# Patient Record
Sex: Female | Born: 1975 | Race: White | Hispanic: No | Marital: Married | Smoking: Never smoker
Health system: Southern US, Community
[De-identification: ages and names within clinical notes are randomized; demographics above are authoritative.]

## PROBLEM LIST (undated history)

## (undated) ENCOUNTER — Inpatient Hospital Stay (HOSPITAL_COMMUNITY): Payer: Self-pay

## (undated) DIAGNOSIS — O09299 Supervision of pregnancy with other poor reproductive or obstetric history, unspecified trimester: Secondary | ICD-10-CM

## (undated) DIAGNOSIS — T4145XA Adverse effect of unspecified anesthetic, initial encounter: Secondary | ICD-10-CM

## (undated) DIAGNOSIS — T8859XA Other complications of anesthesia, initial encounter: Secondary | ICD-10-CM

## (undated) DIAGNOSIS — D649 Anemia, unspecified: Secondary | ICD-10-CM

## (undated) DIAGNOSIS — O09529 Supervision of elderly multigravida, unspecified trimester: Secondary | ICD-10-CM

## (undated) DIAGNOSIS — Z8619 Personal history of other infectious and parasitic diseases: Secondary | ICD-10-CM

## (undated) HISTORY — PX: TONSILLECTOMY: SUR1361

## (undated) HISTORY — DX: Other complications of anesthesia, initial encounter: T88.59XA

## (undated) HISTORY — DX: Anemia, unspecified: D64.9

## (undated) HISTORY — DX: Personal history of other infectious and parasitic diseases: Z86.19

## (undated) HISTORY — DX: Adverse effect of unspecified anesthetic, initial encounter: T41.45XA

## (undated) HISTORY — DX: Supervision of elderly multigravida, unspecified trimester: O09.529

## (undated) HISTORY — PX: CHOLECYSTECTOMY: SHX55

## (undated) HISTORY — DX: Supervision of pregnancy with other poor reproductive or obstetric history, unspecified trimester: O09.299

---

## 2011-08-16 ENCOUNTER — Other Ambulatory Visit (HOSPITAL_COMMUNITY): Payer: Self-pay | Admitting: Obstetrics and Gynecology

## 2011-08-16 DIAGNOSIS — O09529 Supervision of elderly multigravida, unspecified trimester: Secondary | ICD-10-CM

## 2011-08-16 DIAGNOSIS — Z3689 Encounter for other specified antenatal screening: Secondary | ICD-10-CM

## 2011-08-16 LAB — OB RESULTS CONSOLE RUBELLA ANTIBODY, IGM: Rubella: IMMUNE

## 2011-08-16 LAB — OB RESULTS CONSOLE HIV ANTIBODY (ROUTINE TESTING): HIV: NONREACTIVE

## 2011-08-16 LAB — OB RESULTS CONSOLE GC/CHLAMYDIA: Chlamydia: NEGATIVE

## 2011-08-16 LAB — OB RESULTS CONSOLE ABO/RH: RH Type: POSITIVE

## 2011-08-16 LAB — OB RESULTS CONSOLE HEPATITIS B SURFACE ANTIGEN: Hepatitis B Surface Ag: NEGATIVE

## 2011-08-16 LAB — OB RESULTS CONSOLE ANTIBODY SCREEN: Antibody Screen: NEGATIVE

## 2011-08-30 ENCOUNTER — Encounter (HOSPITAL_COMMUNITY): Payer: Self-pay

## 2011-08-30 ENCOUNTER — Ambulatory Visit (HOSPITAL_COMMUNITY)
Admission: RE | Admit: 2011-08-30 | Discharge: 2011-08-30 | Disposition: A | Discharge status: Home / Self Care | Payer: Medicaid Other | Source: Ambulatory Visit | Attending: Obstetrics and Gynecology | Admitting: Obstetrics and Gynecology

## 2011-08-30 DIAGNOSIS — O09529 Supervision of elderly multigravida, unspecified trimester: Secondary | ICD-10-CM

## 2011-08-30 DIAGNOSIS — Z3689 Encounter for other specified antenatal screening: Secondary | ICD-10-CM

## 2011-08-30 DIAGNOSIS — Z1389 Encounter for screening for other disorder: Secondary | ICD-10-CM | POA: Insufficient documentation

## 2011-08-30 DIAGNOSIS — O358XX0 Maternal care for other (suspected) fetal abnormality and damage, not applicable or unspecified: Secondary | ICD-10-CM | POA: Insufficient documentation

## 2011-08-30 DIAGNOSIS — Z363 Encounter for antenatal screening for malformations: Secondary | ICD-10-CM | POA: Insufficient documentation

## 2011-08-31 ENCOUNTER — Other Ambulatory Visit: Payer: Self-pay

## 2011-09-20 NOTE — L&D Delivery Note (Signed)
Delivery Note At 7:07 AM a viable and healthy female was delivered via Vaginal, Spontaneous Delivery (Presentation: Left Occiput Anterior).  APGAR: 8, 9; weight .   Placenta status: Intact, Manual removal.  Cord: 3 vessels with the following complications: Velamentous cord insertion  Pt remained 8 cm dilated for 10 hours.  The fetal vertex palpated smooth but multiple attempts to AROM a membrane were unsuccessful..  The uterus failed to adequately contract despite up to 36 mun of pit. Given the atonic uterus preparations were made for a postpartum hemorrhage. The patient was typed and crossed for 2 units and a 2nd IV placed.  In one last attempt to deliver the patient vaginal the decision was made to attempt to reduce the cervix with maternal pushing.  With descent of the head it became apparent that a membrane was intact.  Amniotomy was performed and the cervix reduced and the patient delivered a vigorous female infant.  The infant was passed to the waiting maternal abdomen.  Cord blood was collected.  The placenta required manual removal due to cord avulsion.  With delivery of the placenta a velamentous cord insertion was noted.  All portions of the placenta were delivered.  The uterus contracted well after delivery.  Methergine was given IM x 1 and misoprostal were placed rectally for PP hemorrhage prophylaxis.  EBL 750cc.  Mother and baby are doing well after delivery  Anesthesia: Epidural  Episiotomy: None Lacerations: None Suture Repair: NA Est. Blood Loss (mL): 750  Mom to postpartum.  Baby to nursery-stable.  Zeina Akkerman H. 01/24/2012, 7:47 AM

## 2012-01-11 ENCOUNTER — Encounter (HOSPITAL_COMMUNITY): Payer: Self-pay | Admitting: *Deleted

## 2012-01-11 ENCOUNTER — Telehealth (HOSPITAL_COMMUNITY): Payer: Self-pay | Admitting: *Deleted

## 2012-01-11 NOTE — Telephone Encounter (Signed)
Preadmission screen  

## 2012-01-13 ENCOUNTER — Encounter (HOSPITAL_COMMUNITY): Payer: Self-pay | Admitting: *Deleted

## 2012-01-13 ENCOUNTER — Telehealth (HOSPITAL_COMMUNITY): Payer: Self-pay | Admitting: *Deleted

## 2012-01-13 NOTE — Telephone Encounter (Signed)
Preadmission screen  

## 2012-01-17 ENCOUNTER — Encounter (HOSPITAL_COMMUNITY): Payer: Self-pay | Admitting: *Deleted

## 2012-01-17 ENCOUNTER — Inpatient Hospital Stay (HOSPITAL_COMMUNITY)
Admission: AD | Admit: 2012-01-17 | Discharge: 2012-01-17 | Disposition: A | Discharge status: Home / Self Care | Payer: BC Managed Care – PPO | Source: Ambulatory Visit | Attending: Obstetrics & Gynecology | Admitting: Obstetrics & Gynecology

## 2012-01-17 DIAGNOSIS — M79609 Pain in unspecified limb: Secondary | ICD-10-CM | POA: Insufficient documentation

## 2012-01-17 DIAGNOSIS — R609 Edema, unspecified: Secondary | ICD-10-CM

## 2012-01-17 DIAGNOSIS — IMO0002 Reserved for concepts with insufficient information to code with codable children: Secondary | ICD-10-CM | POA: Insufficient documentation

## 2012-01-17 NOTE — ED Notes (Signed)
Ultrasonograhic Tech called and made aware of order for bilateral tests, and is on her way to MAU from [facility].

## 2012-01-17 NOTE — MAU Provider Note (Signed)
  History     CSN: [id number]  Arrival date and time: 01/17/12 1748   None     Chief Complaint  Patient presents with  . Leg Pain   HPI This is a 36 year old G8 P4 034 at 61 weeks and one-day who is seen in the MAU for bilateral lower extremity swelling and pain that started approximately 2 weeks ago. The patient noticed areas of swelling that started 3 or 4 days ago. There is no erythema. The pain and swelling has increased over the past 3 or 4 days. No alleviating or provoking factors. Denies shortness of breath, chest pain, fevers, chills, nausea, vomiting.  OB History    Grav Para Term Preterm Abortions TAB SAB Ect Mult Living   8 4 4  0 3 1 2  0 0 4      Past Medical History  Diagnosis Date  . Anemia     first pregnancy  . History of postpartum hemorrhage, currently pregnant   . History of chicken pox   . AMA (advanced maternal age) multigravida 35+   . Complication of anesthesia     woke up kicking and screeming halfway through surgery    Past Surgical History  Procedure Date  . Tonsillectomy   . Cholecystectomy     Family History  Problem Relation Age of Onset  . Kidney disease Mother     stones  . Heart disease Father     cardiac arrest  . COPD Maternal Grandmother     History  Substance Use Topics  . Smoking status: Never Smoker   . Smokeless tobacco: Never Used  . Alcohol Use: Yes     occ wine during pregnancy    Allergies:  Allergies  Allergen Reactions  . Penicillins Hives  . Sulfa Antibiotics Hives    Prescriptions prior to admission  Medication Sig Dispense Refill  . PRENATAL VITAMINS PO Take by mouth.          Review of Systems  All other systems reviewed and are negative.   Physical Exam   Blood pressure 114/68, pulse 93, temperature 98.6 F (37 C), temperature source Oral, resp. rate 16, height 5' 4.5" (1.638 m), weight 96.253 kg (212 lb 3.2 oz), SpO2 100.00%.  Physical Exam  Constitutional: She is oriented to person, place,  and time. She appears well-developed and well-nourished.  HENT:  Head: Normocephalic and atraumatic.  GI: Soft. Bowel sounds are normal. She exhibits no distension and no mass. There is no tenderness. There is no rebound and no guarding.  Musculoskeletal: She exhibits edema.  Neurological: She is alert and oriented to person, place, and time.  Skin: Skin is warm and dry. No erythema.       3+ pitting edema of ankles to the knees. Tender to right medial tibial area 3 cm distal to the patella. Also tender to left inner thigh.  Psychiatric: She has a normal mood and affect. Her behavior is normal. Judgment and thought content normal.    MAU Course  Procedures  MDM Duplex negative for DVT.  Blood pressures stable.  Likely related to hormones of pregnancy.  Assessment and Plan  1.  Peripheral edema in pregnancy.  Discussed with patient normal results.  Recommended frequent movement, putting feet up when able.  Patient to follow up with OB office.  Arelly Whittenberg JEHIEL 01/17/2012, 6:55 PM

## 2012-01-17 NOTE — Progress Notes (Signed)
Pt states she has pain in both legs and legs are sensitive to touch.

## 2012-01-17 NOTE — Progress Notes (Signed)
VASCULAR LAB PRELIMINARY  PRELIMINARY  PRELIMINARY  PRELIMINARY  Bilateral lower extremity venous duplex  completed.    Preliminary report:  Bilateral:  No evidence of DVT, superficial thrombosis, or Baker's Cyst.  Sluggish flow noted in bilateral common femoral veins.    Terance Hart, RVT 01/17/2012, 7:47 PM

## 2012-01-17 NOTE — MAU Note (Signed)
Patient states she has been having swelling and pain with palpation in both legs. Was sent from the office for evaluation Denies any contractions, leaking or bleeding. Was 4 cm in the office today.

## 2012-01-17 NOTE — Discharge Instructions (Signed)
Peripheral Edema You have swelling in your legs (peripheral edema). This swelling is due to excess accumulation of salt and water in your body. Edema may be a sign of heart, kidney or liver disease, or a side effect of a medication. It may also be due to problems in the leg veins. Elevating your legs and using special support stockings may be very helpful, if the cause of the swelling is due to poor venous circulation. Avoid long periods of standing, whatever the cause. Treatment of edema depends on identifying the cause. Chips, pretzels, pickles and other salty foods should be avoided. Restricting salt in your diet is almost always needed. Water pills (diuretics) are often used to remove the excess salt and water from your body via urine. These medicines prevent the kidney from reabsorbing sodium. This increases urine flow. Diuretic treatment may also result in lowering of potassium levels in your body. Potassium supplements may be needed if you have to use diuretics daily. Daily weights can help you keep track of your progress in clearing your edema. You should call your caregiver for follow up care as recommended. SEEK IMMEDIATE MEDICAL CARE IF:   You have increased swelling, pain, redness, or heat in your legs.   You develop shortness of breath, especially when lying down.   You develop chest or abdominal pain, weakness, or fainting.   You have a fever.  Document Released: 10/13/2004 Document Revised: 08/25/2011 Document Reviewed: 09/23/2009 [employer] Patient Information 2012 [employer]. 

## 2012-01-19 ENCOUNTER — Telehealth (HOSPITAL_COMMUNITY): Payer: Self-pay | Admitting: *Deleted

## 2012-01-19 NOTE — Telephone Encounter (Signed)
Preadmission screen  

## 2012-01-23 ENCOUNTER — Inpatient Hospital Stay (HOSPITAL_COMMUNITY): Payer: BC Managed Care – PPO | Admitting: Anesthesiology

## 2012-01-23 ENCOUNTER — Inpatient Hospital Stay (HOSPITAL_COMMUNITY)
Admission: RE | Admit: 2012-01-23 | Discharge: 2012-01-26 | DRG: 374 | Disposition: A | Discharge status: Home / Self Care | Payer: BC Managed Care – PPO | Source: Ambulatory Visit | Attending: Obstetrics and Gynecology | Admitting: Obstetrics and Gynecology

## 2012-01-23 ENCOUNTER — Encounter (HOSPITAL_COMMUNITY): Payer: Self-pay | Admitting: Anesthesiology

## 2012-01-23 DIAGNOSIS — Z6835 Body mass index (BMI) 35.0-35.9, adult: Secondary | ICD-10-CM

## 2012-01-23 DIAGNOSIS — E669 Obesity, unspecified: Secondary | ICD-10-CM | POA: Diagnosis present

## 2012-01-23 DIAGNOSIS — O99214 Obesity complicating childbirth: Secondary | ICD-10-CM | POA: Diagnosis present

## 2012-01-23 DIAGNOSIS — O99892 Other specified diseases and conditions complicating childbirth: Secondary | ICD-10-CM | POA: Diagnosis present

## 2012-01-23 DIAGNOSIS — O09529 Supervision of elderly multigravida, unspecified trimester: Secondary | ICD-10-CM | POA: Diagnosis present

## 2012-01-23 DIAGNOSIS — Z22330 Carrier of Group B streptococcus: Secondary | ICD-10-CM

## 2012-01-23 DIAGNOSIS — Z302 Encounter for sterilization: Secondary | ICD-10-CM

## 2012-01-23 LAB — RPR: RPR Ser Ql: NONREACTIVE

## 2012-01-23 LAB — CBC
MCHC: 32.9 g/dL (ref 30.0–36.0)
Platelets: 159 10*3/uL (ref 150–400)
RDW: 13.8 % (ref 11.5–15.5)

## 2012-01-23 MED ORDER — OXYTOCIN 20 UNITS IN LACTATED RINGERS INFUSION - SIMPLE
125.0000 mL/h | Freq: Once | INTRAVENOUS | Status: AC
  Administered 2012-01-24: 999 mL/h via INTRAVENOUS

## 2012-01-23 MED ORDER — FENTANYL 2.5 MCG/ML BUPIVACAINE 1/10 % EPIDURAL INFUSION (WH - ANES)
14.0000 mL/h | INTRAMUSCULAR | Status: DC
  Administered 2012-01-24: 14 mL/h via EPIDURAL
  Filled 2012-01-23 (×2): qty 60

## 2012-01-23 MED ORDER — EPHEDRINE 5 MG/ML INJ
10.0000 mg | INTRAVENOUS | Status: DC | PRN
  Filled 2012-01-23: qty 4

## 2012-01-23 MED ORDER — OXYTOCIN 20 UNITS IN LACTATED RINGERS INFUSION - SIMPLE
1.0000 m[IU]/min | INTRAVENOUS | Status: DC
  Administered 2012-01-23: 14 m[IU]/min via INTRAVENOUS
  Administered 2012-01-23: 16 m[IU]/min via INTRAVENOUS
  Administered 2012-01-23: 26 m[IU]/min via INTRAVENOUS
  Administered 2012-01-23: 2 m[IU]/min via INTRAVENOUS
  Administered 2012-01-23: 22 m[IU]/min via INTRAVENOUS
  Administered 2012-01-23: 12 m[IU]/min via INTRAVENOUS
  Administered 2012-01-23: 18 m[IU]/min via INTRAVENOUS
  Filled 2012-01-23 (×2): qty 1000

## 2012-01-23 MED ORDER — VANCOMYCIN HCL IN DEXTROSE 1-5 GM/200ML-% IV SOLN
1000.0000 mg | Freq: Two times a day (BID) | INTRAVENOUS | Status: DC
  Administered 2012-01-23 – 2012-01-24 (×3): 1000 mg via INTRAVENOUS
  Filled 2012-01-23 (×3): qty 200

## 2012-01-23 MED ORDER — LIDOCAINE HCL (PF) 1 % IJ SOLN
INTRAMUSCULAR | Status: DC | PRN
  Administered 2012-01-23 (×2): 8 mL

## 2012-01-23 MED ORDER — OXYCODONE-ACETAMINOPHEN 5-325 MG PO TABS: 1.0000 | ORAL_TABLET | ORAL | Status: DC | PRN

## 2012-01-23 MED ORDER — PHENYLEPHRINE 40 MCG/ML (10ML) SYRINGE FOR IV PUSH (FOR BLOOD PRESSURE SUPPORT)
80.0000 ug | PREFILLED_SYRINGE | INTRAVENOUS | Status: DC | PRN
  Filled 2012-01-23: qty 5

## 2012-01-23 MED ORDER — LACTATED RINGERS IV SOLN: 500.0000 mL | Freq: Once | INTRAVENOUS | Status: DC

## 2012-01-23 MED ORDER — TERBUTALINE SULFATE 1 MG/ML IJ SOLN: 0.2500 mg | Freq: Once | INTRAMUSCULAR | Status: AC | PRN

## 2012-01-23 MED ORDER — EPHEDRINE 5 MG/ML INJ: 10.0000 mg | INTRAVENOUS | Status: DC | PRN

## 2012-01-23 MED ORDER — LACTATED RINGERS IV SOLN
INTRAVENOUS | Status: DC
  Administered 2012-01-23: 125 mL/h via INTRAVENOUS
  Administered 2012-01-23: 09:00:00 via INTRAVENOUS

## 2012-01-23 MED ORDER — OXYTOCIN BOLUS FROM INFUSION
500.0000 mL | Freq: Once | INTRAVENOUS | Status: DC
  Filled 2012-01-23: qty 500

## 2012-01-23 MED ORDER — LIDOCAINE HCL (PF) 1 % IJ SOLN
30.0000 mL | INTRAMUSCULAR | Status: DC | PRN
  Filled 2012-01-23: qty 30

## 2012-01-23 MED ORDER — FLEET ENEMA 7-19 GM/118ML RE ENEM: 1.0000 | ENEMA | RECTAL | Status: DC | PRN

## 2012-01-23 MED ORDER — PHENYLEPHRINE 40 MCG/ML (10ML) SYRINGE FOR IV PUSH (FOR BLOOD PRESSURE SUPPORT): 80.0000 ug | PREFILLED_SYRINGE | INTRAVENOUS | Status: DC | PRN

## 2012-01-23 MED ORDER — DIPHENHYDRAMINE HCL 50 MG/ML IJ SOLN: 12.5000 mg | INTRAMUSCULAR | Status: DC | PRN

## 2012-01-23 MED ORDER — FENTANYL 2.5 MCG/ML BUPIVACAINE 1/10 % EPIDURAL INFUSION (WH - ANES)
INTRAMUSCULAR | Status: DC | PRN
  Administered 2012-01-23: 14 mL/h via EPIDURAL

## 2012-01-23 MED ORDER — CITRIC ACID-SODIUM CITRATE 334-500 MG/5ML PO SOLN
30.0000 mL | ORAL | Status: DC | PRN
  Filled 2012-01-23: qty 15

## 2012-01-23 MED ORDER — LACTATED RINGERS IV SOLN
500.0000 mL | INTRAVENOUS | Status: DC | PRN
  Administered 2012-01-23: 500 mL via INTRAVENOUS

## 2012-01-23 MED ORDER — ACETAMINOPHEN 325 MG PO TABS: 650.0000 mg | ORAL_TABLET | ORAL | Status: DC | PRN

## 2012-01-23 MED ORDER — IBUPROFEN 600 MG PO TABS
600.0000 mg | ORAL_TABLET | Freq: Four times a day (QID) | ORAL | Status: DC | PRN
  Administered 2012-01-24: 600 mg via ORAL
  Filled 2012-01-23: qty 1

## 2012-01-23 MED ORDER — ONDANSETRON HCL 4 MG/2ML IJ SOLN: 4.0000 mg | Freq: Four times a day (QID) | INTRAMUSCULAR | Status: DC | PRN

## 2012-01-23 MED ORDER — BUTORPHANOL TARTRATE 2 MG/ML IJ SOLN
1.0000 mg | INTRAMUSCULAR | Status: DC | PRN
  Administered 2012-01-23 (×2): 1 mg via INTRAVENOUS
  Filled 2012-01-23 (×4): qty 1

## 2012-01-23 NOTE — H&P (Signed)
  CC: IOL HPI: 36 yo [id number] @ 39+0 Presents for induction of labor 4 term pregnancy and advanced cervical dilation. Her current pregnancy has been complicated by advanced maternal age. I. The patient declines genetic screening and amniocentesis. Anatomy ultrasound at maternal fetal medicine was normal. A posterior placenta previa was noted on that exam. A followup ultrasound in the third trimester had demonstrated resolution of the previa. The patient is group B strep positive she is allergic to penicillin and distributive group B strep is resistant to clindamycin but sensitive to vancomycin. Therefore we will proceed with vancomycin for group B strep prophylaxis.  Past medical history: 1) advanced maternal age 36) anemia 3) grand multiparity 4) obesity BMI 35 PSH: 1) tonsils and adenoids 2) 36 cholecystectomy Past OB:  1995: EAB  2003:40 week SVD female 8 lbs. 10 oz.  2005:40 week SVD female 8 lbs. 6 oz.  2009:40 weeks SVD female 8 lbs. 11 oz.  2009:12 week missed AB  2010: 11 week missed AB  2011:40 week SVD female 8 pounds 12.5 ounces Past GYN: No abnormal Pap smears, no STDs Meds: None Allergies: 1) penicillin 2) sulfa Social history: Negative x3 Prenatal labs: Blood type B positive, antibody screen negative, RPR nonreactive, rubella titer immune, hep B surface antigen negative, HIV nonreactive, one-hour Glucola 94, group B strep positive, resistant to clindamycin, sensitive to vancomycin. Anatomy ultrasound normal anatomy. Physical exam: Filed Vitals:   01/23/12 0814 01/23/12 0918  BP: 112/61 115/70  Pulse: 88 85  Temp: 97.7 F (36.5 C)   Resp: 20 20   Alert and oriented x3, no apparent distress Obese gravid, soft, nontender Cervix 4/80/-2 Fetal heart tones 140s with accelerations, no decelerations, reactive Toco: Irregularly Assessment and plan: 36 year-old [id number] at 39 weeks for induction of labor 1) admit 2) vancomycin for GBS positive 3) AROM when able 4) epidural on  request

## 2012-01-23 NOTE — Anesthesia Preprocedure Evaluation (Signed)
Anesthesia Evaluation  Patient identified by MRN, date of birth, ID band Patient awake    Reviewed: Allergy & Precautions, H&P , NPO status , Patient's Chart, lab work & pertinent test results  Airway Mallampati: II TM Distance: >3 FB Neck ROM: full    Dental No notable dental hx.    Pulmonary neg pulmonary ROS,    Pulmonary exam normal       Cardiovascular negative cardio ROS      Neuro/Psych negative neurological ROS  negative psych ROS   GI/Hepatic negative GI ROS, Neg liver ROS,   Endo/Other  Morbid obesity  Renal/GU negative Renal ROS  negative genitourinary   Musculoskeletal negative musculoskeletal ROS (+)   Abdominal (+) + obese,   Peds negative pediatric ROS (+)  Hematology negative hematology ROS (+)   Anesthesia Other Findings   Reproductive/Obstetrics (+) Pregnancy                           Anesthesia Physical Anesthesia Plan  ASA: III  Anesthesia Plan: Epidural   Post-op Pain Management:    Induction:   Airway Management Planned:   Additional Equipment:   Intra-op Plan:   Post-operative Plan:   Informed Consent: I have reviewed the patients History and Physical, chart, labs and discussed the procedure including the risks, benefits and alternatives for the proposed anesthesia with the patient or authorized representative who has indicated his/her understanding and acceptance.     Plan Discussed with:   Anesthesia Plan Comments:         Anesthesia Quick Evaluation  

## 2012-01-23 NOTE — Progress Notes (Signed)
Patient ID: Monica Pollard, female   DOB: 23-Apr-1976, 36 y.o.   MRN: [id number] S: Pt still 8cm.  RN has been noting more bloody show. O: AFVSS cvx 8/c/-1 FHT 120-130 + accels no decels toco Q3-4  IUPC placed without difficulty Bloody fluid greater than would be expected with bloody show  A/P  1) Bleeding concerning for abruption.  FWB is reassuring with accelerations and no decelerations. 2) Contractions are inadequate with IUPC.  Pt currently at 26.  Will change the pitocin to a fresh bag and see if contraction pattern becomes more adequate.  D/W pt if contractions become adequate and still no cervical change then would need to proceed with epidural. Pt understands that if NRFWB develops and there isnt time for a spinal that a c/s would be performed under general anesthesia

## 2012-01-23 NOTE — Anesthesia Procedure Notes (Signed)
Epidural Patient location during procedure: OB Start time: 01/23/2012 10:45 PM End time: 01/23/2012 10:50 PM Reason for block: procedure for pain  Staffing Anesthesiologist: Sandrea Hughs  Preanesthetic Checklist Completed: patient identified, site marked, surgical consent, pre-op evaluation, timeout performed, IV checked, risks and benefits discussed and monitors and equipment checked  Epidural Patient position: sitting Prep: site prepped and draped and DuraPrep Patient monitoring: continuous pulse ox and blood pressure Approach: midline Injection technique: LOR air  Needle:  Needle type: Tuohy  Needle gauge: 17 G Needle length: 9 cm Needle insertion depth: 6 cm Catheter type: closed end flexible Catheter size: 19 Gauge Catheter at skin depth: 11 cm Test dose: negative and Other  Assessment Sensory level: T8 Events: blood not aspirated, injection not painful, no injection resistance, negative IV test and no paresthesia

## 2012-01-23 NOTE — Progress Notes (Signed)
Dr. Tenny Craw on the phone and notified of pt status, FHR, UC pattern, pitocin level, SVE and bloody show. MD states that she will reassess in a couple of hours. Will continue to monitor.

## 2012-01-23 NOTE — Progress Notes (Signed)
Patient ID: Monica Pollard, female   DOB: [birth year], 36 y.o.   MRN: [id number]  S: Feeling occasional contractions O: AFVSS cvx 4-5/c/-2 toco q 2-4  AROM clear fluid  A/P 1) S/P vanc 2) FWB reassuring 3) Cont pit

## 2012-01-23 NOTE — Progress Notes (Signed)
Dr. Tenny Craw at the bedside to assess pt bleeding, SVE, UC pattern, and place IUPC. Will continue to monitor.

## 2012-01-23 NOTE — Progress Notes (Signed)
Patient ID: Monica Pollard, female   DOB: 04/10/1976, 36 y.o.   MRN: [id number]  S: Feeling contractions O: Filed Vitals:   01/23/12 1506 01/23/12 1548 01/23/12 1625 01/23/12 1725  BP: 112/69 119/75 110/63 125/79  Pulse: 75 70 72 75  Temp: 98.6 F (37 C)     TempSrc: Oral     Resp: 20 20 20 20   Height:      Weight:       AOX3, NAD Cvx 7/80/-2 toco q2  AROM larger hole in bag.  Attempted to place IUPC but pt unable to tolerate.  A/P 1) Continue pit, currently at 20 2) FWB reassuring 3) If no further cervical change in 1-2 hours recommended proceeding with epidural.

## 2012-01-24 ENCOUNTER — Encounter (HOSPITAL_COMMUNITY): Payer: Self-pay

## 2012-01-24 LAB — CBC
HCT: 27.9 % — ABNORMAL LOW (ref 36.0–46.0)
Hemoglobin: 9 g/dL — ABNORMAL LOW (ref 12.0–15.0)
MCHC: 32.3 g/dL (ref 30.0–36.0)

## 2012-01-24 LAB — SURGICAL PCR SCREEN: MRSA, PCR: NEGATIVE

## 2012-01-24 LAB — PREPARE RBC (CROSSMATCH)

## 2012-01-24 MED ORDER — SIMETHICONE 80 MG PO CHEW
80.0000 mg | CHEWABLE_TABLET | ORAL | Status: DC | PRN
  Administered 2012-01-25: 80 mg via ORAL

## 2012-01-24 MED ORDER — WITCH HAZEL-GLYCERIN EX PADS: 1.0000 "application " | MEDICATED_PAD | CUTANEOUS | Status: DC | PRN

## 2012-01-24 MED ORDER — PRENATAL MULTIVITAMIN CH
1.0000 | ORAL_TABLET | Freq: Every day | ORAL | Status: DC
  Administered 2012-01-24: 1 via ORAL
  Filled 2012-01-24 (×2): qty 1

## 2012-01-24 MED ORDER — METOCLOPRAMIDE HCL 10 MG PO TABS
10.0000 mg | ORAL_TABLET | Freq: Once | ORAL | Status: AC
  Administered 2012-01-25: 10 mg via ORAL
  Filled 2012-01-24: qty 1

## 2012-01-24 MED ORDER — DIBUCAINE 1 % RE OINT: 1.0000 "application " | TOPICAL_OINTMENT | RECTAL | Status: DC | PRN

## 2012-01-24 MED ORDER — SENNOSIDES-DOCUSATE SODIUM 8.6-50 MG PO TABS
2.0000 | ORAL_TABLET | Freq: Every day | ORAL | Status: DC
  Administered 2012-01-24 – 2012-01-25 (×2): 2 via ORAL

## 2012-01-24 MED ORDER — MISOPROSTOL 25 MCG QUARTER TABLET
25.0000 ug | ORAL_TABLET | Freq: Once | ORAL | Status: AC
  Administered 2012-01-24: 25 ug via ORAL
  Filled 2012-01-24: qty 0.25

## 2012-01-24 MED ORDER — BENZOCAINE-MENTHOL 20-0.5 % EX AERO: 1.0000 "application " | INHALATION_SPRAY | CUTANEOUS | Status: DC | PRN

## 2012-01-24 MED ORDER — LANOLIN HYDROUS EX OINT: TOPICAL_OINTMENT | CUTANEOUS | Status: DC | PRN

## 2012-01-24 MED ORDER — VANCOMYCIN HCL IN DEXTROSE 1-5 GM/200ML-% IV SOLN
1000.0000 mg | Freq: Once | INTRAVENOUS | Status: AC
  Administered 2012-01-24: 1000 mg via INTRAVENOUS
  Filled 2012-01-24: qty 200

## 2012-01-24 MED ORDER — OXYCODONE-ACETAMINOPHEN 5-325 MG PO TABS
1.0000 | ORAL_TABLET | ORAL | Status: DC | PRN
  Administered 2012-01-25 – 2012-01-26 (×5): 1 via ORAL
  Filled 2012-01-24 (×5): qty 1

## 2012-01-24 MED ORDER — SODIUM CHLORIDE 0.9 % IJ SOLN
3.0000 mL | Freq: Two times a day (BID) | INTRAMUSCULAR | Status: DC
  Administered 2012-01-24: 3 mL via INTRAVENOUS

## 2012-01-24 MED ORDER — ZOLPIDEM TARTRATE 5 MG PO TABS: 5.0000 mg | ORAL_TABLET | Freq: Every evening | ORAL | Status: DC | PRN

## 2012-01-24 MED ORDER — METHYLERGONOVINE MALEATE 0.2 MG/ML IJ SOLN: 0.2000 mg | INTRAMUSCULAR | Status: DC | PRN

## 2012-01-24 MED ORDER — IBUPROFEN 600 MG PO TABS
600.0000 mg | ORAL_TABLET | Freq: Four times a day (QID) | ORAL | Status: DC
  Administered 2012-01-24 – 2012-01-26 (×6): 600 mg via ORAL
  Filled 2012-01-24 (×6): qty 1

## 2012-01-24 MED ORDER — TETANUS-DIPHTH-ACELL PERTUSSIS 5-2.5-18.5 LF-MCG/0.5 IM SUSP: 0.5000 mL | Freq: Once | INTRAMUSCULAR | Status: DC

## 2012-01-24 MED ORDER — LACTATED RINGERS IV SOLN
INTRAVENOUS | Status: DC
  Administered 2012-01-25: 20 mL/h via INTRAVENOUS

## 2012-01-24 MED ORDER — ONDANSETRON HCL 4 MG PO TABS: 4.0000 mg | ORAL_TABLET | ORAL | Status: DC | PRN

## 2012-01-24 MED ORDER — METHYLERGONOVINE MALEATE 0.2 MG PO TABS: 0.2000 mg | ORAL_TABLET | ORAL | Status: DC | PRN

## 2012-01-24 MED ORDER — DIPHENHYDRAMINE HCL 25 MG PO CAPS: 25.0000 mg | ORAL_CAPSULE | Freq: Four times a day (QID) | ORAL | Status: DC | PRN

## 2012-01-24 MED ORDER — CARBOPROST TROMETHAMINE 250 MCG/ML IM SOLN
INTRAMUSCULAR | Status: AC
  Filled 2012-01-24: qty 1

## 2012-01-24 MED ORDER — MISOPROSTOL 200 MCG PO TABS
ORAL_TABLET | ORAL | Status: AC
  Administered 2012-01-24: 800 ug
  Filled 2012-01-24: qty 4

## 2012-01-24 MED ORDER — ONDANSETRON HCL 4 MG/2ML IJ SOLN: 4.0000 mg | INTRAMUSCULAR | Status: DC | PRN

## 2012-01-24 MED ORDER — FAMOTIDINE 20 MG PO TABS
40.0000 mg | ORAL_TABLET | Freq: Once | ORAL | Status: AC
  Administered 2012-01-25: 40 mg via ORAL
  Filled 2012-01-24: qty 1

## 2012-01-24 MED ORDER — METHYLERGONOVINE MALEATE 0.2 MG/ML IJ SOLN
INTRAMUSCULAR | Status: AC
  Administered 2012-01-24: 0.2 mg
  Filled 2012-01-24: qty 1

## 2012-01-24 NOTE — Progress Notes (Signed)
Patient ID: Josefa Half, female   DOB: 05-02-1976, 36 y.o.   MRN: [id number]  S: Comfortable with epidural O:  Filed Vitals:   01/24/12 0231 01/24/12 0257 01/24/12 0301 01/24/12 0302  BP: 128/62  103/55   Pulse: 76 29 79 70  Temp:      TempSrc:      Resp: 18  18   Height:      Weight:      SpO2:  97%  98%   AOx3, NAD Cvx 8/c/0 toco irregular  Able to attempt manual rotation of the vertex.  No caput, no molding.  Pelvis feels adequate.  Bedside ultrasound performed with sonosite, EFW 3800gm.  Pitocin discontinued when patient on 36 mu/min.  At that time MVUs were 45.  IUPC removed and replaced and MVU measurements were accurate. Pt still with bloody fluid. FWB reassuring with accelerations and reactive. Cytotec given x 1.  A/P 1) FWB reassuring 2) Minimal response to cytotec 3) Will restart pitocin in 1-2 hours 4) Pt understands c/s may be required.  Given lack of uterine contractility pt is at a significant risk for PP hemorrhage.  Will type and cross for 2 units.

## 2012-01-24 NOTE — Progress Notes (Signed)
Dr. Tenny Craw at the desk and orders received to restart pitocin drip. Will continue to monitor.

## 2012-01-24 NOTE — Progress Notes (Signed)
NSVD of viable female. 

## 2012-01-24 NOTE — Anesthesia Postprocedure Evaluation (Signed)
  Anesthesia Post Note  Patient: Monica Pollard  Procedure(s) Performed: * No procedures listed *  Anesthesia type: Epidural  Patient location: Mother/Baby  Post pain: Pain level controlled  Post assessment: Post-op Vital signs reviewed  Last Vitals:  Filed Vitals:   01/24/12 1500  BP: 101/55  Pulse: 75  Temp: 36.7 C  Resp: 18    Post vital signs: Reviewed  Level of consciousness:alert  Complications: No apparent anesthesia complications

## 2012-01-24 NOTE — Progress Notes (Signed)
Dr. Tenny Craw at the bedside to push with pt and try and reduce cervix.

## 2012-01-24 NOTE — Progress Notes (Signed)
Dr. Tenny Craw on the phone and notified of pt status, FHR, SVE, bleeding, blood clots, UC pattern, pitocin level, and MVUs. Orders received to continue increasing until 40 mu. Will continue to monitor.

## 2012-01-24 NOTE — Progress Notes (Signed)
Dr. Tenny Craw at the bedside discussing plan of care and possibility of caesarean section delivery. Pt states understanding.

## 2012-01-25 ENCOUNTER — Encounter (HOSPITAL_COMMUNITY): Payer: Self-pay | Admitting: Anesthesiology

## 2012-01-25 ENCOUNTER — Encounter (HOSPITAL_COMMUNITY)
Admission: RE | Disposition: A | Discharge status: Home / Self Care | Payer: Self-pay | Source: Ambulatory Visit | Attending: Obstetrics and Gynecology

## 2012-01-25 ENCOUNTER — Inpatient Hospital Stay (HOSPITAL_COMMUNITY): Payer: BC Managed Care – PPO | Admitting: Anesthesiology

## 2012-01-25 HISTORY — PX: TUBAL LIGATION: SHX77

## 2012-01-25 LAB — CBC
Hemoglobin: 8.4 g/dL — ABNORMAL LOW (ref 12.0–15.0)
MCH: 28.8 pg (ref 26.0–34.0)
MCHC: 32.7 g/dL (ref 30.0–36.0)
Platelets: 147 10*3/uL — ABNORMAL LOW (ref 150–400)
RDW: 13.6 % (ref 11.5–15.5)

## 2012-01-25 SURGERY — LIGATION, FALLOPIAN TUBE, POSTPARTUM
Anesthesia: Epidural | Laterality: Bilateral

## 2012-01-25 MED ORDER — MIDAZOLAM HCL 2 MG/2ML IJ SOLN
INTRAMUSCULAR | Status: AC
  Filled 2012-01-25: qty 2

## 2012-01-25 MED ORDER — FENTANYL CITRATE 0.05 MG/ML IJ SOLN
INTRAMUSCULAR | Status: DC | PRN
  Administered 2012-01-25 (×4): 50 ug via INTRAVENOUS

## 2012-01-25 MED ORDER — KETOROLAC TROMETHAMINE 30 MG/ML IJ SOLN
INTRAMUSCULAR | Status: AC
  Administered 2012-01-25: 30 mg via INTRAVENOUS
  Filled 2012-01-25: qty 1

## 2012-01-25 MED ORDER — BUPIVACAINE HCL (PF) 0.25 % IJ SOLN
INTRAMUSCULAR | Status: DC | PRN
  Administered 2012-01-25: 7 mL

## 2012-01-25 MED ORDER — MIDAZOLAM HCL 5 MG/5ML IJ SOLN
INTRAMUSCULAR | Status: DC | PRN
  Administered 2012-01-25: 2 mg via INTRAVENOUS

## 2012-01-25 MED ORDER — ONDANSETRON HCL 4 MG/2ML IJ SOLN
INTRAMUSCULAR | Status: AC
  Filled 2012-01-25: qty 2

## 2012-01-25 MED ORDER — DEXAMETHASONE SODIUM PHOSPHATE 10 MG/ML IJ SOLN
INTRAMUSCULAR | Status: DC | PRN
  Administered 2012-01-25: 10 mg via INTRAVENOUS

## 2012-01-25 MED ORDER — SODIUM BICARBONATE 8.4 % IV SOLN
INTRAVENOUS | Status: DC | PRN
  Administered 2012-01-25: 5 mL via EPIDURAL

## 2012-01-25 MED ORDER — FENTANYL CITRATE 0.05 MG/ML IJ SOLN
INTRAMUSCULAR | Status: AC
  Administered 2012-01-25: 50 ug via INTRAVENOUS
  Filled 2012-01-25: qty 2

## 2012-01-25 MED ORDER — FENTANYL CITRATE 0.05 MG/ML IJ SOLN
INTRAMUSCULAR | Status: AC
  Filled 2012-01-25: qty 2

## 2012-01-25 MED ORDER — ACETAMINOPHEN 325 MG PO TABS
650.0000 mg | ORAL_TABLET | ORAL | Status: DC | PRN
  Administered 2012-01-25: 650 mg via ORAL
  Filled 2012-01-25: qty 2

## 2012-01-25 MED ORDER — ONDANSETRON HCL 4 MG/2ML IJ SOLN
INTRAMUSCULAR | Status: DC | PRN
  Administered 2012-01-25: 4 mg via INTRAVENOUS

## 2012-01-25 MED ORDER — LIDOCAINE-EPINEPHRINE (PF) 2 %-1:200000 IJ SOLN
INTRAMUSCULAR | Status: AC
  Filled 2012-01-25: qty 20

## 2012-01-25 MED ORDER — BUPIVACAINE HCL (PF) 0.25 % IJ SOLN
INTRAMUSCULAR | Status: AC
  Filled 2012-01-25: qty 30

## 2012-01-25 MED ORDER — FENTANYL CITRATE 0.05 MG/ML IJ SOLN
25.0000 ug | INTRAMUSCULAR | Status: DC | PRN
  Administered 2012-01-25 (×2): 50 ug via INTRAVENOUS

## 2012-01-25 MED ORDER — SODIUM BICARBONATE 8.4 % IV SOLN
INTRAVENOUS | Status: AC
  Filled 2012-01-25: qty 50

## 2012-01-25 MED ORDER — KETOROLAC TROMETHAMINE 30 MG/ML IJ SOLN
15.0000 mg | Freq: Once | INTRAMUSCULAR | Status: AC | PRN
  Administered 2012-01-25: 30 mg via INTRAVENOUS

## 2012-01-25 SURGICAL SUPPLY — 19 items
CLIP FILSHIE TUBAL LIGA STRL (Clip) ×2 IMPLANT
CLOTH BEACON ORANGE TIMEOUT ST (SAFETY) ×2 IMPLANT
ELECT REM PT RETURN 9FT ADLT (ELECTROSURGICAL) ×2
ELECTRODE REM PT RTRN 9FT ADLT (ELECTROSURGICAL) ×1 IMPLANT
GLOVE BIO SURGEON STRL SZ7 (GLOVE) ×4 IMPLANT
GOWN PREVENTION PLUS LG XLONG (DISPOSABLE) ×4 IMPLANT
NEEDLE HYPO 25X1 1.5 SAFETY (NEEDLE) ×2 IMPLANT
NS IRRIG 1000ML POUR BTL (IV SOLUTION) ×2 IMPLANT
PACK ABDOMINAL MINOR (CUSTOM PROCEDURE TRAY) ×2 IMPLANT
PENCIL BUTTON HOLSTER BLD 10FT (ELECTRODE) ×2 IMPLANT
SPONGE LAP 4X18 X RAY DECT (DISPOSABLE) IMPLANT
SUT PLAIN 2 0 (SUTURE) ×1
SUT PLAIN ABS 2-0 54XMFL TIE (SUTURE) ×1 IMPLANT
SUT VICRYL 0 UR6 27IN ABS (SUTURE) ×2 IMPLANT
SUT VICRYL 4-0 PS2 18IN ABS (SUTURE) ×2 IMPLANT
SYR CONTROL 10ML LL (SYRINGE) ×2 IMPLANT
TOWEL OR 17X24 6PK STRL BLUE (TOWEL DISPOSABLE) ×4 IMPLANT
TRAY FOLEY CATH 14FR (SET/KITS/TRAYS/PACK) ×2 IMPLANT
WATER STERILE IRR 1000ML POUR (IV SOLUTION) ×2 IMPLANT

## 2012-01-25 NOTE — Addendum Note (Signed)
Addendum  created 01/25/12 1629 by Algis Greenhouse, CRNA   Modules edited:Notes Section

## 2012-01-25 NOTE — Transfer of Care (Signed)
Anesthesia Post Note  Patient: Monica Pollard  Procedure(s) Performed: Procedure(s) (LRB): POST PARTUM TUBAL LIGATION (Bilateral)  Anesthesia type: Epidural  Patient location: Mother/Baby  Post pain: Pain level controlled  Post assessment: Post-op Vital signs reviewed  Last Vitals:  Filed Vitals:   01/25/12 1511  BP: 112/72  Pulse: 60  Temp: 36.7 C  Resp: 18    Post vital signs: Reviewed  Level of consciousness:alert  Complications: No apparent anesthesia complications

## 2012-01-25 NOTE — Transfer of Care (Signed)
Immediate Anesthesia Transfer of Care Note  Patient: Monica Pollard  Procedure(s) Performed: Procedure(s) (LRB): POST PARTUM TUBAL LIGATION (Bilateral)  Patient Location: PACU  Anesthesia Type: Epidural  Level of Consciousness: sedated  Airway & Oxygen Therapy: Patient Spontanous Breathing  Post-op Assessment: Report given to PACU RN  Post vital signs: Reviewed and stable  Complications: No apparent anesthesia complications

## 2012-01-25 NOTE — Op Note (Signed)
Pre-Operative Diagnosis: 1) Desired Permanent Sterilization Postoperative Diagnosis: 1) Same Procedure: Postpartum Bilateral Tubal Ligation with Filshie Clips Surgeon: Dr. Waynard Reeds Assistant: None Anesthesia: Epidural and 0.25% Marcaine injected locally Operative Findings: Normal fallopian tubes bilaterally Specimen: None EBL: Minimal  Monica Pollard is a 36 yo [id number] s/p a term spontaneous vaginal delivery who desires permanent sterilization.  Risks, benefits, and alternatives of the procedure were reviewed with the patient.  Following the appropriate informed consent the patient was taken to the OR where adequate surgical anesthesia was confirmed.  After a time-out procedure was performed that appropriately identified the patient the abdomen was prepped and draped in the normal sterile fashion.  A vertical infraumbilical skin incision was made with the scalpel and the underlying subcutaneous tissue was dissected bluntly to the level of the fascia. The peritoneum was incised and entry into the abdomen was confirmed with palpation of the fundus.  The patient was tilted to the left and the right fallopian tube was identified, grasped with a babcock clamp, and followed out to the fimbriated end.  The filshie clip was applied to the proximal portion of the fallopian tube and retuned to the abdominal cavity.  The patient was then tilted to the right and the same procedure was repeated on the left fallopian tube.  The fascia was closed with 0 vicryl in a running fashion.  The subcutaneous tissue was infiltrated with 7 cc of 0.25% Marcaine and the skin was closed with 4-0 vicryl in a subcuticular fashion.  All sponge, lap, and needle counts were correct x 2.  The patient tolerated the procedure well and was brought to the recovery room in stable condition.

## 2012-01-25 NOTE — Anesthesia Postprocedure Evaluation (Signed)
Anesthesia Post Note  Patient: Monica Pollard  Procedure(s) Performed: Procedure(s) (LRB): POST PARTUM TUBAL LIGATION (Bilateral)  Anesthesia type: Epidural  Patient location: PACU  Post pain: Pain level controlled  Post assessment: Post-op Vital signs reviewed  Last Vitals:  Filed Vitals:   01/25/12 1315  BP: 91/62  Pulse: 71  Temp:   Resp: 16    Post vital signs: stable  Level of consciousness: awake  Complications: No apparent anesthesia complications

## 2012-01-25 NOTE — Anesthesia Preprocedure Evaluation (Signed)
Anesthesia Evaluation  Patient identified by MRN, date of birth, ID band Patient awake    Reviewed: Allergy & Precautions, H&P , NPO status , Patient's Chart, lab work & pertinent test results  History of Anesthesia Complications (+) Emergence Delirium  Airway Mallampati: II TM Distance: >3 FB Neck ROM: full    Dental No notable dental hx.    Pulmonary neg pulmonary ROS,    Pulmonary exam normal       Cardiovascular negative cardio ROS      Neuro/Psych negative neurological ROS  negative psych ROS   GI/Hepatic negative GI ROS, Neg liver ROS,   Endo/Other  Morbid obesity  Renal/GU negative Renal ROS  negative genitourinary   Musculoskeletal negative musculoskeletal ROS (+)   Abdominal (+) + obese,   Peds negative pediatric ROS (+)  Hematology  (+) Blood dyscrasia (hgb 8.4), anemia ,   Anesthesia Other Findings   Reproductive/Obstetrics (+) Pregnancy                           Anesthesia Physical  Anesthesia Plan  ASA: II  Anesthesia Plan: Epidural   Post-op Pain Management:    Induction:   Airway Management Planned:   Additional Equipment:   Intra-op Plan:   Post-operative Plan:   Informed Consent: I have reviewed the patients History and Physical, chart, labs and discussed the procedure including the risks, benefits and alternatives for the proposed anesthesia with the patient or authorized representative who has indicated his/her understanding and acceptance.     Plan Discussed with: Surgeon and CRNA  Anesthesia Plan Comments:         Anesthesia Quick Evaluation

## 2012-01-25 NOTE — Progress Notes (Addendum)
Post Partum Day 1 Subjective: no complaints, up ad lib, voiding, tolerating PO and + flatus  Objective: Blood pressure 100/60, pulse 73, temperature 97.8 F (36.6 C), temperature source Oral, resp. rate 18, height 5\' 4"  (1.626 m), weight 95.255 kg (210 lb), SpO2 98.00%, unknown if currently breastfeeding.  Physical Exam:  General: alert, cooperative and appears stated age Lochia: appropriate Uterine Fundus: firm   Basename 01/25/12 0555 01/24/12 0630  HGB 8.4* 9.0*  HCT 25.7* 27.9*    Assessment/Plan: Breastfeeding Pt NPO for PP BTL, currently scheduled for 12:30 Pt declines neonatal circ   LOS: 2 days   Shaquaya Wuellner H. 01/25/2012, 10:02 AM

## 2012-01-26 ENCOUNTER — Encounter (HOSPITAL_COMMUNITY): Payer: Self-pay | Admitting: Obstetrics and Gynecology

## 2012-01-26 MED ORDER — OXYCODONE-ACETAMINOPHEN 5-325 MG PO TABS: 1.0000 | ORAL_TABLET | ORAL | Status: AC | PRN

## 2012-01-26 NOTE — Progress Notes (Signed)
Patient is eating, ambulating, voiding.  Pain control is good.  Filed Vitals:   01/25/12 1511 01/25/12 1915 01/25/12 2301 01/26/12 0549  BP: 112/72 118/69 107/64 96/60  Pulse: 60 62 68 74  Temp: 98.1 F (36.7 C) 98.8 F (37.1 C) 98.3 F (36.8 C) 98.7 F (37.1 C)  TempSrc: Oral Oral Oral Oral  Resp: 18 18 18 18   Height:      Weight:      SpO2: 98%       Fundus firm Perineum without swelling.  Lab Results  Component Value Date   WBC 10.0 01/25/2012   HGB 8.4* 01/25/2012   HCT 25.7* 01/25/2012   MCV 88.0 01/25/2012   PLT 147* 01/25/2012    --/--/B POS, B POS (05/07 0315)/RI  A/P Post partum day 2 and POD BTL 1.  Routine care.  Expect d/c today.    Pranay Hilbun A

## 2012-01-26 NOTE — Discharge Summary (Signed)
Obstetric Discharge Summary Reason for Admission: induction of labor Prenatal Procedures: none Intrapartum Procedures: spontaneous vaginal delivery Postpartum Procedures: P.P. tubal ligation Complications-Operative and Postpartum: none Hemoglobin  Date Value Range Status  01/25/2012 8.4* 12.0-15.0 (g/dL) Final     HCT  Date Value Range Status  01/25/2012 25.7* 36.0-46.0 (%) Final    Discharge Diagnoses: Term Pregnancy-delivered  Discharge Information: Date: 01/26/2012 Activity: pelvic rest Diet: routine Medications: Ibuprofen Condition: stable Instructions: refer to practice specific booklet Discharge to: home Follow-up Information    Follow up with Almon Hercules., MD. Schedule an appointment as soon as possible for a visit in 4 weeks.   Contact information:   [address] [place] [address] [phone]          Newborn Data: Live born female  Birth Weight: 9 lb 0.1 oz (4085 g) APGAR: 8, 9  Home with mother.  Oyindamola Key A 01/26/2012, 7:55 AM

## 2012-01-28 LAB — TYPE AND SCREEN
ABO/RH(D): B POS
Unit division: 0

## 2012-08-13 ENCOUNTER — Encounter: Payer: Self-pay | Admitting: Physician Assistant

## 2012-08-13 ENCOUNTER — Ambulatory Visit (INDEPENDENT_AMBULATORY_CARE_PROVIDER_SITE_OTHER): Payer: BC Managed Care – PPO | Admitting: Physician Assistant

## 2012-08-13 VITALS — BP 89/52 | HR 58 | Temp 98.1°F | Ht 64.0 in | Wt 175.0 lb

## 2012-08-13 DIAGNOSIS — M545 Low back pain, unspecified: Secondary | ICD-10-CM

## 2012-08-13 DIAGNOSIS — R300 Dysuria: Secondary | ICD-10-CM

## 2012-08-13 DIAGNOSIS — R109 Unspecified abdominal pain: Secondary | ICD-10-CM

## 2012-08-13 DIAGNOSIS — R10A0 Flank pain, unspecified side: Secondary | ICD-10-CM

## 2012-08-13 LAB — POCT URINALYSIS DIPSTICK
Glucose, UA: NEGATIVE
Ketones, UA: NEGATIVE
Leukocytes, UA: NEGATIVE
Protein, UA: NEGATIVE

## 2012-08-13 NOTE — Progress Notes (Signed)
  Subjective:    Patient ID: Monica Pollard, female    DOB: 03-31-1976, 36 y.o.   MRN: [id number]  HPI Patient is a 36 yo female who presents to the clinic as a new patient to establish care. PMH reviewed positive for anemia after childbirth. Taking iron currently.   Family, social , surgical hx all reviewed and updated.   She has had this back/side pain on both side for 3 days since adjustment by choiropractor Dr. Arville Care. She has been seeing him for 1 month. She has had a lot of lower cocxy pain since the birth of her son 6 months ago. It was a traumatic delivery. She denies any fever, chills, N/V, bowel changes, increased frequency, or abdominal pressure. She has had some mild painful urination.She wants to make sure not UTI. She has not had any pain that wakes her up at night. Denies numbness or tingling going down legs. Pain is characterized as a dull ache. Sitting makes worse. Standing and moving makes better. Not tried anything except cushion when sitting.      Review of Systems     Objective:   Physical Exam  Constitutional: She is oriented to person, place, and time. She appears well-developed and well-nourished.  HENT:  Head: Normocephalic and atraumatic.  Cardiovascular: Normal rate, regular rhythm and normal heart sounds.   Pulmonary/Chest: Effort normal and breath sounds normal. She has no wheezes.  Musculoskeletal:       Normal ROM at waist and both legs with no pain. NO tenderness over the spine. Tightness in the lower Paraspinous muscles. Negative straight leg test.   Neurological: She is alert and oriented to person, place, and time.  Skin: Skin is warm and dry.  Psychiatric: She has a normal mood and affect. Her behavior is normal.          Assessment & Plan:  Bilateral flank pain/Pain in lower back-UA is negative. Sent for culture. From physical exam I suspect musculoskeletal etiology. I suggested Ibuprofen 800mg  up to three times a day. Ice and then hot bath to  relax muscles. Gave PT exercises with lateral, side to side and front to back movement to start daily. Cannot take a lot of medicines due to breastfeeding and patient prefers wholistic approach. Suggested that she stop going to chiorpractor since pain started after adjustment on saturday. Let's give your back and muscles some time to heal.Could consider imaging but suggested waiting until ready to take action. Let's try conservative/symptomatice treatment right now. Patient was given warning signs that something more serious could be going on and she was instructed to call office. Discussed that strained muscle and even if there was some disc herniation most of the time respond well to conservative therapy. Discussed healing after delievery could very possibly take some time with the trauma. Prednisone could be an option later.  Make appt when she wants to talk about further treatment.    Discussed Tdap. Think she probably got after birth of last child. Will wait for records.

## 2012-08-13 NOTE — Patient Instructions (Addendum)
Can take up to 800mg  three times a day. Can alternate with Tylenol. Heating pad 15-20 minutes at a time. Stop for 3-4 weeks.   Start with daily PT exercises discussed.   Will consider Imaging vs Prednisone vs PT vs Muscle relaxer's.

## 2012-08-15 ENCOUNTER — Encounter: Payer: Self-pay | Admitting: Physician Assistant

## 2012-08-15 LAB — URINE CULTURE
Colony Count: NO GROWTH
Organism ID, Bacteria: NO GROWTH

## 2012-09-18 ENCOUNTER — Ambulatory Visit: Payer: BC Managed Care – PPO | Admitting: Sports Medicine

## 2013-02-22 ENCOUNTER — Encounter: Payer: Self-pay | Admitting: Physician Assistant

## 2013-02-22 ENCOUNTER — Ambulatory Visit (INDEPENDENT_AMBULATORY_CARE_PROVIDER_SITE_OTHER): Payer: BC Managed Care – PPO | Admitting: Physician Assistant

## 2013-02-22 VITALS — BP 114/79 | HR 61 | Temp 98.4°F | Wt 185.0 lb

## 2013-02-22 DIAGNOSIS — R300 Dysuria: Secondary | ICD-10-CM

## 2013-02-22 DIAGNOSIS — N390 Urinary tract infection, site not specified: Secondary | ICD-10-CM

## 2013-02-22 LAB — POCT URINALYSIS DIPSTICK
Nitrite, UA: NEGATIVE
Protein, UA: NEGATIVE
Spec Grav, UA: 1.01
Urobilinogen, UA: 0.2

## 2013-02-22 MED ORDER — CIPROFLOXACIN HCL 500 MG PO TABS: 500.0000 mg | ORAL_TABLET | Freq: Two times a day (BID) | ORAL | Status: DC

## 2013-02-22 NOTE — Progress Notes (Signed)
  Subjective:    Patient ID: Monica Pollard, female    DOB: [birth year], 37 y.o.   MRN: [id number]  Dysuria  This is a new problem. Episode onset: for 2 days.  The problem occurs every urination. The problem has been gradually worsening. The quality of the pain is described as burning and aching. The pain is at a severity of 3/10. The pain is mild. There has been no fever. She is sexually active. There is no history of pyelonephritis. Associated symptoms include frequency and urgency. Pertinent negatives include no chills, discharge, flank pain, hematuria, hesitancy, nausea, possible pregnancy, sweats or vomiting. She has tried increased fluids and NSAIDs for the symptoms. The treatment provided no relief. There is no history of recurrent UTIs.     Review of Systems  Constitutional: Negative for chills.  Gastrointestinal: Negative for nausea and vomiting.  Genitourinary: Positive for dysuria, urgency and frequency. Negative for hesitancy, hematuria and flank pain.       Objective:   Physical Exam  Constitutional: She is oriented to person, place, and time. She appears well-developed and well-nourished.  HENT:  Head: Normocephalic and atraumatic.  Cardiovascular: Normal rate, regular rhythm and normal heart sounds.   Pulmonary/Chest: Effort normal and breath sounds normal.  NO CVA tenderness.  Abdominal: Soft. Bowel sounds are normal. There is no tenderness.  Neurological: She is alert and oriented to person, place, and time.  Skin: Skin is warm and dry.  Psychiatric: She has a normal mood and affect. Her behavior is normal.          Assessment & Plan:  UTI- UA positive for blood and leuks. Treated with cipro for 3 days. Gave ho for symptomatic relief. Call if not improving.

## 2013-02-22 NOTE — Patient Instructions (Signed)
Urinary Tract Infection  Urinary tract infections (UTIs) can develop anywhere along your urinary tract. Your urinary tract is your body's drainage system for removing wastes and extra water. Your urinary tract includes two kidneys, two ureters, a bladder, and a urethra. Your kidneys are a pair of bean-shaped organs. Each kidney is about the size of your fist. They are located below your ribs, one on each side of your spine.  CAUSES  Infections are caused by microbes, which are microscopic organisms, including fungi, viruses, and bacteria. These organisms are so small that they can only be seen through a microscope. Bacteria are the microbes that most commonly cause UTIs.  SYMPTOMS   Symptoms of UTIs may vary by age and gender of the patient and by the location of the infection. Symptoms in young women typically include a frequent and intense urge to urinate and a painful, burning feeling in the bladder or urethra during urination. Older women and men are more likely to be tired, shaky, and weak and have muscle aches and abdominal pain. A fever may mean the infection is in your kidneys. Other symptoms of a kidney infection include pain in your back or sides below the ribs, nausea, and vomiting.  DIAGNOSIS  To diagnose a UTI, your caregiver will ask you about your symptoms. Your caregiver also will ask to provide a urine sample. The urine sample will be tested for bacteria and white blood cells. White blood cells are made by your body to help fight infection.  TREATMENT   Typically, UTIs can be treated with medication. Because most UTIs are caused by a bacterial infection, they usually can be treated with the use of antibiotics. The choice of antibiotic and length of treatment depend on your symptoms and the type of bacteria causing your infection.  HOME CARE INSTRUCTIONS   If you were prescribed antibiotics, take them exactly as your caregiver instructs you. Finish the medication even if you feel better after you  have only taken some of the medication.   Drink enough water and fluids to keep your urine clear or pale yellow.   Avoid caffeine, tea, and carbonated beverages. They tend to irritate your bladder.   Empty your bladder often. Avoid holding urine for long periods of time.   Empty your bladder before and after sexual intercourse.   After a bowel movement, women should cleanse from front to back. Use each tissue only once.  SEEK MEDICAL CARE IF:    You have back pain.   You develop a fever.   Your symptoms do not begin to resolve within 3 days.  SEEK IMMEDIATE MEDICAL CARE IF:    You have severe back pain or lower abdominal pain.   You develop chills.   You have nausea or vomiting.   You have continued burning or discomfort with urination.  MAKE SURE YOU:    Understand these instructions.   Will watch your condition.   Will get help right away if you are not doing well or get worse.  Document Released: 06/15/2005 Document Revised: 03/06/2012 Document Reviewed: 10/14/2011  Exit[employer] Patient Information 2014 [employer].

## 2014-07-21 ENCOUNTER — Encounter: Payer: Self-pay | Admitting: Physician Assistant

## 2014-10-24 ENCOUNTER — Emergency Department (INDEPENDENT_AMBULATORY_CARE_PROVIDER_SITE_OTHER): Payer: BLUE CROSS/BLUE SHIELD

## 2014-10-24 ENCOUNTER — Encounter: Payer: Self-pay | Admitting: Emergency Medicine

## 2014-10-24 ENCOUNTER — Emergency Department
Admission: EM | Admit: 2014-10-24 | Discharge: 2014-10-24 | Disposition: A | Discharge status: Home / Self Care | Payer: BLUE CROSS/BLUE SHIELD | Source: Home / Self Care | Attending: Family Medicine | Admitting: Family Medicine

## 2014-10-24 DIAGNOSIS — M7062 Trochanteric bursitis, left hip: Secondary | ICD-10-CM

## 2014-10-24 DIAGNOSIS — M545 Low back pain, unspecified: Secondary | ICD-10-CM

## 2014-10-24 DIAGNOSIS — M533 Sacrococcygeal disorders, not elsewhere classified: Secondary | ICD-10-CM

## 2014-10-24 DIAGNOSIS — G8929 Other chronic pain: Secondary | ICD-10-CM

## 2014-10-24 MED ORDER — CYCLOBENZAPRINE HCL 10 MG PO TABS: ORAL_TABLET | ORAL | Status: DC

## 2014-10-24 MED ORDER — MELOXICAM 15 MG PO TABS: 15.0000 mg | ORAL_TABLET | Freq: Every day | ORAL | Status: DC

## 2014-10-24 MED ORDER — OXYCODONE HCL 5 MG PO TABS: ORAL_TABLET | ORAL | Status: DC

## 2014-10-24 NOTE — ED Provider Notes (Signed)
CSN: [id number]     Arrival date & time 10/24/14  1135 History   First MD Initiated Contact with Patient 10/24/14 1207     Chief Complaint  Patient presents with  . Back Pain      HPI Comments: Patient reports that since May 2013 after an epidural when she delivered her fifth child, she has had chronic coccyx pain. One week ago after a bath, she developed bilateral non-radiating lumbar pain, somewhat worse on the left.  The pain has gradually become worse over the past four days, improved after chiropractic treatment but rapidly recurring.  She recalls no injury, although recently she had spent a day looking at houses to buy.  Her pain is worse when standing, walking, bending over and climbing stairs.  The pain has decreased since she began wearing a back brace yesterday and applying ice therapy.  She denies bowel or bladder dysfunction, and no saddle numbness.          Patient is a 39 y.o. female presenting with back pain. The history is provided by the patient and the spouse.  Back Pain Location:  Lumbar spine Quality:  Aching and stiffness Stiffness is present:  All day Radiates to:  Does not radiate Pain severity:  Moderate Pain is:  Worse during the day Onset quality:  Sudden Duration:  1 week Timing:  Constant Progression:  Worsening Chronicity:  New Context: not falling, not lifting heavy objects and not recent injury   Relieved by: back brace and ice therapy. Worsened by:  Ambulation, bending, movement, standing and twisting Ineffective treatments: chiropractic. Associated symptoms: no abdominal pain, no bladder incontinence, no bowel incontinence, no dysuria, no fever, no leg pain, no numbness, no paresthesias, no pelvic pain, no perianal numbness, no tingling, no weakness and no weight loss   Risk factors: obesity     Past Medical History  Diagnosis Date  . Anemia     first pregnancy  . History of postpartum hemorrhage, currently pregnant   . History of chicken pox    . AMA (advanced maternal age) multigravida 35+   . Complication of anesthesia     woke up kicking and screeming halfway through surgery   Past Surgical History  Procedure Laterality Date  . Tonsillectomy    . Tubal ligation  01/25/2012    Procedure: POST PARTUM TUBAL LIGATION;  Surgeon: Freddrick MarchKendra H. Tenny Crawoss, MD;  Location: [facility];  Service: Gynecology;  Laterality: Bilateral;  . Cholecystectomy     Family History  Problem Relation Age of Onset  . Kidney disease Mother     stones  . Heart disease Father     cardiac arrest  . COPD Maternal Grandmother    History  Substance Use Topics  . Smoking status: Never Smoker   . Smokeless tobacco: Never Used  . Alcohol Use: Yes     Comment: occ wine during pregnancy   OB History    Gravida Para Term Preterm AB TAB SAB Ectopic Multiple Living   8 5 5  0 3 1 2  0 0 5     Review of Systems  Constitutional: Negative for fever and weight loss.  Gastrointestinal: Negative for abdominal pain and bowel incontinence.  Genitourinary: Negative for bladder incontinence, dysuria and pelvic pain.  Musculoskeletal: Positive for back pain.  Neurological: Negative for tingling, weakness, numbness and paresthesias.    Allergies  Penicillins and Sulfa antibiotics  Home Medications   Prior to Admission medications   Medication Sig Start Date End Date  Taking? Authorizing Provider  cyclobenzaprine (FLEXERIL) 10 MG tablet Take one tab by mouth at bedtime for muscle spasm 10/24/14   Lattie Haw, MD  meloxicam (MOBIC) 15 MG tablet Take 1 tablet (15 mg total) by mouth daily. Take with food each morning 10/24/14   Lattie Haw, MD  oxyCODONE (ROXICODONE) 5 MG immediate release tablet Take one or two by mouth at bedtime as needed for pain 10/24/14   Lattie Haw, MD   BP 124/83 mmHg  Pulse 73  Temp(Src) 97.7 F (36.5 C) (Oral)  SpO2 99%  LMP 10/16/2014 Physical Exam  Constitutional: She is oriented to person, place, and time. She appears well-developed  and well-nourished. No distress.  Patient is obese.  She has difficulty climbing onto exam table.  HENT:  Head: Normocephalic.  Eyes: Pupils are equal, round, and reactive to light.  Cardiovascular: Normal heart sounds.   Pulmonary/Chest: Breath sounds normal.  Abdominal: There is no tenderness.  Musculoskeletal:       Lumbar back: She exhibits decreased range of motion and tenderness. She exhibits no bony tenderness, no swelling and no edema.       Back:  Back:  Decreased range of motion. Can heel/toe walk and squat with difficulty. Tenderness in the midline from approximately L1 to L5.  No paraspinous muscle tenderness.  There is tendeness over the coccyx.  Straight leg raising test is negative.  Sitting knee extension test is negative.  Strength and sensation in the lower extremities is normal.  Patellar and achilles reflexes are normal.  Left hip reveals distinct tenderness over the greater trochanter.  Palpating the greater trochanter during resisted lateral abduction of the hip causes pain. No tenderness over right hip  Lymphadenopathy:    She has no cervical adenopathy.  Neurological: She is alert and oriented to person, place, and time.  Skin: Skin is warm and dry. No rash noted.  Nursing note and vitals reviewed.   ED Course  Procedures  none      Imaging Review Dg Lumbar Spine Complete  10/24/2014   CLINICAL DATA:  Right lower back pain for 1 week.  No known injury.  EXAM: LUMBAR SPINE - COMPLETE 4+ VIEW  COMPARISON:  None.  FINDINGS: There are five lumbar-type vertebral bodies. No fracture or malalignment. Disc spaces well maintained. SI joints are symmetric.  IMPRESSION: Negative.   Electronically Signed   By: Charlett Nose M.D.   On: 10/24/2014 13:12     MDM   1. Midline low back pain without sciatica   2. Trochanteric bursitis of left hip   3. Chronic coccygeal pain    Note that patient's low back pain improved significantly after external rotation of both hips  during exam.  Her chronic coccyx pain may be causing abnormal gait dynamic, contributing to her low back pain. Rx for Mobic, Flexeril.  Oxycodone at bedtime prn  Apply ice pack for 20 to 30 minutes, 3 to 4 times daily  Continue until pain decreases.  Begin back and hip exercises when tolerated. Followup with Dr. Rodney [facility] ([facility] in about 10 days.    Lattie Haw, MD 10/26/14 [phone]

## 2014-10-24 NOTE — ED Notes (Signed)
Pt c/o lower back pain x1 week. States she has seen her chiropractor with no relief. States she can hardly move since this started.

## 2014-10-24 NOTE — ED Notes (Signed)
Appt with Dr T on Monday 11/03/14 @ 1:30 pm gave info to patient. She acknowledges understanding.

## 2014-10-24 NOTE — Discharge Instructions (Signed)
Apply ice pack for 20 to 30 minutes, 3 to 4 times daily  Continue until pain decreases.  Begin back and hip exercises when tolerated.   Back Exercises Back exercises help treat and prevent back injuries. The goal of back exercises is to increase the strength of your abdominal and back muscles and the flexibility of your back. These exercises should be started when you no longer have back pain. Back exercises include:  Pelvic Tilt. Lie on your back with your knees bent. Tilt your pelvis until the lower part of your back is against the floor. Hold this position 5 to 10 sec and repeat 5 to 10 times.  Knee to Chest. Pull first 1 knee up against your chest and hold for 20 to 30 seconds, repeat this with the other knee, and then both knees. This may be done with the other leg straight or bent, whichever feels better.  Sit-Ups or Curl-Ups. Bend your knees 90 degrees. Start with tilting your pelvis, and do a partial, slow sit-up, lifting your trunk only 30 to 45 degrees off the floor. Take at least 2 to 3 seconds for each sit-up. Do not do sit-ups with your knees out straight. If partial sit-ups are difficult, simply do the above but with only tightening your abdominal muscles and holding it as directed.  Hip-Lift. Lie on your back with your knees flexed 90 degrees. Push down with your feet and shoulders as you raise your hips a couple inches off the floor; hold for 10 seconds, repeat 5 to 10 times.  Back arches. Lie on your stomach, propping yourself up on bent elbows. Slowly press on your hands, causing an arch in your low back. Repeat 3 to 5 times. Any initial stiffness and discomfort should lessen with repetition over time.  Shoulder-Lifts. Lie face down with arms beside your body. Keep hips and torso pressed to floor as you slowly lift your head and shoulders off the floor. Do not overdo your exercises, especially in the beginning. Exercises may cause you some mild back discomfort which lasts for a few  minutes; however, if the pain is more severe, or lasts for more than 15 minutes, do not continue exercises until you see your caregiver. Improvement with exercise therapy for back problems is slow.  See your caregivers for assistance with developing a proper back exercise program. Document Released: 10/13/2004 Document Revised: 11/28/2011 Document Reviewed: 07/07/2011 [facility] Patient Information 2015 [employer]. This information is not intended to replace advice given to you by your health care provider. Make sure you discuss any questions you have with your health care provider.

## 2014-11-03 ENCOUNTER — Institutional Professional Consult (permissible substitution): Payer: BC Managed Care – PPO | Admitting: Sports Medicine

## 2014-11-18 ENCOUNTER — Encounter: Payer: BC Managed Care – PPO | Admitting: Physician Assistant

## 2014-11-25 ENCOUNTER — Other Ambulatory Visit (HOSPITAL_COMMUNITY)
Admission: RE | Admit: 2014-11-25 | Discharge: 2014-11-25 | Disposition: A | Discharge status: Home / Self Care | Payer: BLUE CROSS/BLUE SHIELD | Source: Ambulatory Visit | Attending: Physician Assistant | Admitting: Physician Assistant

## 2014-11-25 ENCOUNTER — Encounter: Payer: Self-pay | Admitting: Physician Assistant

## 2014-11-25 ENCOUNTER — Other Ambulatory Visit: Payer: Self-pay | Admitting: Physician Assistant

## 2014-11-25 ENCOUNTER — Ambulatory Visit (INDEPENDENT_AMBULATORY_CARE_PROVIDER_SITE_OTHER): Payer: BLUE CROSS/BLUE SHIELD | Admitting: Physician Assistant

## 2014-11-25 VITALS — BP 121/68 | HR 62 | Ht 64.0 in | Wt 188.0 lb

## 2014-11-25 DIAGNOSIS — Z131 Encounter for screening for diabetes mellitus: Secondary | ICD-10-CM

## 2014-11-25 DIAGNOSIS — N6320 Unspecified lump in the left breast, unspecified quadrant: Secondary | ICD-10-CM

## 2014-11-25 DIAGNOSIS — Z01419 Encounter for gynecological examination (general) (routine) without abnormal findings: Secondary | ICD-10-CM | POA: Insufficient documentation

## 2014-11-25 DIAGNOSIS — Z0000 Encounter for general adult medical examination without abnormal findings: Secondary | ICD-10-CM

## 2014-11-25 DIAGNOSIS — Z1151 Encounter for screening for human papillomavirus (HPV): Secondary | ICD-10-CM | POA: Insufficient documentation

## 2014-11-25 DIAGNOSIS — E669 Obesity, unspecified: Secondary | ICD-10-CM | POA: Insufficient documentation

## 2014-11-25 DIAGNOSIS — Z113 Encounter for screening for infections with a predominantly sexual mode of transmission: Secondary | ICD-10-CM | POA: Diagnosis present

## 2014-11-25 DIAGNOSIS — N760 Acute vaginitis: Secondary | ICD-10-CM | POA: Insufficient documentation

## 2014-11-25 DIAGNOSIS — Z1239 Encounter for other screening for malignant neoplasm of breast: Secondary | ICD-10-CM

## 2014-11-25 DIAGNOSIS — N6325 Unspecified lump in the left breast, overlapping quadrants: Secondary | ICD-10-CM

## 2014-11-25 DIAGNOSIS — Z13220 Encounter for screening for lipoid disorders: Secondary | ICD-10-CM

## 2014-11-25 DIAGNOSIS — N63 Unspecified lump in breast: Secondary | ICD-10-CM

## 2014-11-25 NOTE — Progress Notes (Signed)
  Subjective:     Monica Pollard is a 39 y.o. female and is here for a comprehensive physical exam. The patient reports problems - left breast lump left outer quadrant. noticed for last month or so. no pain or nipple discharge. no nipple retraction. family hx of breast cancer with mother. never had mammogram. .  History   Social History  . Marital Status: Married    Spouse Name: N/A  . Number of Children: N/A  . Years of Education: N/A   Occupational History  . Not on file.   Social History Main Topics  . Smoking status: Never Smoker   . Smokeless tobacco: Never Used  . Alcohol Use: Yes     Comment: occ wine during pregnancy  . Drug Use: No  . Sexual Activity: Yes    Birth Control/ Protection: Surgical   Other Topics Concern  . Not on file   Social History Narrative   Health Maintenance  Topic Date Due  . INFLUENZA VACCINE  11/24/2024 (Originally 04/19/2014)  . TETANUS/TDAP  11/24/2024 (Originally 06/12/1993)  . PAP SMEAR  11/24/2017  . HIV Screening  Completed    The following portions of the patient's history were reviewed and updated as appropriate: allergies, current medications, past family history, past medical history, past social history, past surgical history and problem list.  Review of Systems A comprehensive review of systems was negative.   Objective:    BP 121/68 mmHg  Pulse 62  Ht 5\' 4"  (1.626 m)  Wt 188 lb (85.276 kg)  BMI 32.25 kg/m2 General appearance: alert, cooperative, appears stated age and mildly obese Head: Normocephalic, without obvious abnormality, atraumatic Eyes: conjunctivae/corneas clear. PERRL, EOM's intact. Fundi benign. Ears: normal TM's and external ear canals both ears Nose: Nares normal. Septum midline. Mucosa normal. No drainage or sinus tenderness. Throat: lips, mucosa, and tongue normal; teeth and gums normal Neck: no adenopathy, no carotid bruit, no JVD, supple, symmetrical, trachea midline and thyroid not enlarged, symmetric,  no tenderness/mass/nodules Back: symmetric, no curvature. ROM normal. No CVA tenderness. Lungs: clear to auscultation bilaterally Breasts: normal appearance and no masses of right breast. walnut sized lump left breast at 3 oclock, non tender.  no nipple retraction or nipple discharge.  Heart: regular rate and rhythm, S1, S2 normal, no murmur, click, rub or gallop Abdomen: soft, non-tender; bowel sounds normal; no masses,  no organomegaly Pelvic: cervix normal in appearance, external genitalia normal, no adnexal masses or tenderness, no cervical motion tenderness, uterus normal size, shape, and consistency and vagina normal without discharge Extremities: extremities normal, atraumatic, no cyanosis or edema Pulses: 2+ and symmetric Skin: Skin color, texture, turgor normal. No rashes or lesions Lymph nodes: Cervical, supraclavicular, and axillary nodes normal. Neurologic: Grossly normal    Assessment:    Healthy female exam.      Plan:     CPE- pap done today. Added STD panel.  Pt declined all vaccines. Fasting labs ordered. Discussed weight loss. Goal weight loss is 20lbs. Discussed 1500 calorie diet and at least 150 minutes a week of exercise.  Encouraged calcium 4 servings a day and vitamin d 800units.   Lump left breast at 3 oclock- will get diagnostic mammogram. Ordered cbc.  See After Visit Summary for Counseling Recommendations

## 2014-11-25 NOTE — Patient Instructions (Addendum)
Keeping You Healthy  Get These Tests 1. Blood Pressure- Have your blood pressure checked once a year by your health care provider.  Normal blood pressure is 120/80. 2. Weight- Have your body mass index (BMI) calculated to screen for obesity.  BMI is measure of body fat based on height and weight.  You can also calculate your own BMI at https://www.west-esparza.com/www.nhlbisupport.com/bmi/. 3. Cholesterol- Have your cholesterol checked every 5 years starting at age 41 then yearly starting at age 41. 4. Chlamydia, HIV, and other sexually transmitted diseases- Get screened every year until age 41, then within three months of each new sexual provider. 5. Pap Smear- Every 1-3 years; discuss with your health care provider. 6. Mammogram- Every year starting at age 41  Take these medicines  Calcium with Vitamin D-Your body needs 1200 mg of Calcium each day and [phone] IU of Vitamin D daily.  Your body can only absorb 500 mg of Calcium at a time so Calcium must be taken in 2 or 3 divided doses throughout the day.  Multivitamin with folic acid- Once daily if it is possible for you to become pregnant.  Get these Immunizations  Gardasil-Series of three doses; prevents HPV related illness such as genital warts and cervical cancer.  Menactra-Single dose; prevents meningitis.  Tetanus shot- Every 10 years.  Flu shot-Every year.  Take these steps 1. Do not smoke-Your healthcare provider can help you quit.  For tips on how to quit go to www.smokefree.gov or call 1-800 QUITNOW. 2. Be physically active- Exercise 5 days a week for at least 30 minutes.  If you are not already physically active, start slow and gradually work up to 30 minutes of moderate physical activity.  Examples of moderate activity include walking briskly, dancing, swimming, bicycling, etc. 3. Breast Cancer- A self breast exam every month is important for early detection of breast cancer.  For more information and instruction on self breast exams, ask your  healthcare provider or [place].eswww.womenshealth.gov/faq/breast-self-exam.cfm. 4. Eat a healthy diet- Eat a variety of healthy foods such as fruits, vegetables, whole grains, low fat milk, low fat cheeses, yogurt, lean meats, poultry and fish, beans, nuts, tofu, etc.  For more information go to www. Thenutritionsource.org 5. Drink alcohol in moderation- Limit alcohol intake to one drink or less per day. Never drink and drive. 6. Depression- Your emotional health is as important as your physical health.  If you're feeling down or losing interest in things you normally enjoy please talk to your healthcare provider about being screened for depression. 7. Dental visit- Brush and floss your teeth twice daily; visit your dentist twice a year. 8. Eye doctor- Get an eye exam at least every 2 years. 9. Helmet use- Always wear a helmet when riding a bicycle, motorcycle, rollerblading or skateboarding. 10. Safe sex- If you may be exposed to sexually transmitted infections, use a condom. 11. Seat belts- Seat belts can save your live; always wear one. 12. Smoke/Carbon Monoxide detectors- These detectors need to be installed on the appropriate level of your home. Replace batteries at least once a year. 13. Skin cancer- When out in the sun please cover up and use sunscreen 15 SPF or higher. 14. Violence- If anyone is threatening or hurting you, please tell your healthcare provider.         WIll get diagnostic mammogram

## 2014-11-26 LAB — CYTOLOGY - PAP

## 2014-11-26 LAB — CERVICOVAGINAL ANCILLARY ONLY
BACTERIAL VAGINITIS: POSITIVE — AB
Bacterial vaginitis: POSITIVE — AB
Candida vaginitis: NEGATIVE

## 2014-11-27 ENCOUNTER — Other Ambulatory Visit: Payer: Self-pay

## 2014-11-27 MED ORDER — METRONIDAZOLE 500 MG PO TABS: 500.0000 mg | ORAL_TABLET | Freq: Two times a day (BID) | ORAL | Status: DC

## 2014-11-28 ENCOUNTER — Telehealth: Payer: Self-pay | Admitting: Emergency Medicine

## 2014-11-28 LAB — CERVICOVAGINAL ANCILLARY ONLY: HERPES (WINDOWPATH): NEGATIVE

## 2014-12-03 ENCOUNTER — Other Ambulatory Visit: Payer: BC Managed Care – PPO

## 2014-12-09 ENCOUNTER — Encounter (INDEPENDENT_AMBULATORY_CARE_PROVIDER_SITE_OTHER): Payer: Self-pay

## 2014-12-09 ENCOUNTER — Ambulatory Visit
Admission: RE | Admit: 2014-12-09 | Discharge: 2014-12-09 | Disposition: A | Discharge status: Home / Self Care | Payer: BLUE CROSS/BLUE SHIELD | Source: Ambulatory Visit | Attending: Physician Assistant | Admitting: Physician Assistant

## 2014-12-09 DIAGNOSIS — N6320 Unspecified lump in the left breast, unspecified quadrant: Principal | ICD-10-CM

## 2014-12-09 DIAGNOSIS — Z1239 Encounter for other screening for malignant neoplasm of breast: Secondary | ICD-10-CM

## 2014-12-09 DIAGNOSIS — N6325 Unspecified lump in the left breast, overlapping quadrants: Secondary | ICD-10-CM

## 2016-02-04 IMAGING — DX DG LUMBAR SPINE COMPLETE 4+V
5 series · 5 of 5 positions shown · non-contrast
Comparison: None.

CLINICAL DATA: Right lower back pain for 1 week.  No known injury.

EXAM:
LUMBAR SPINE - COMPLETE 4+ VIEW

[l-spine ap]
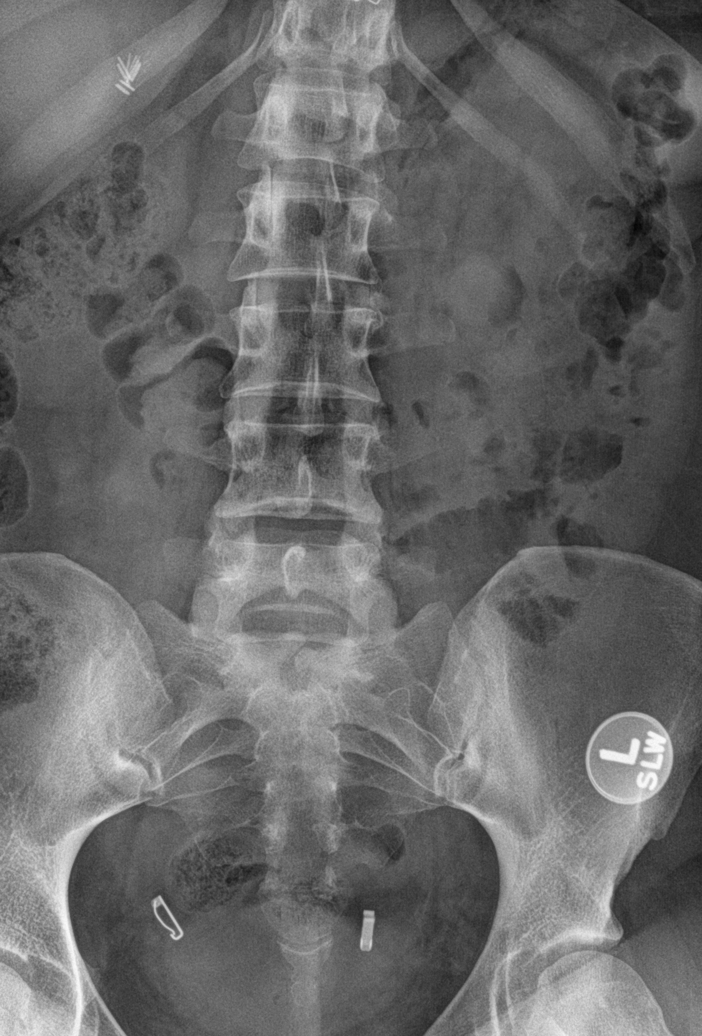

[l-spine obl (1 of 2)]
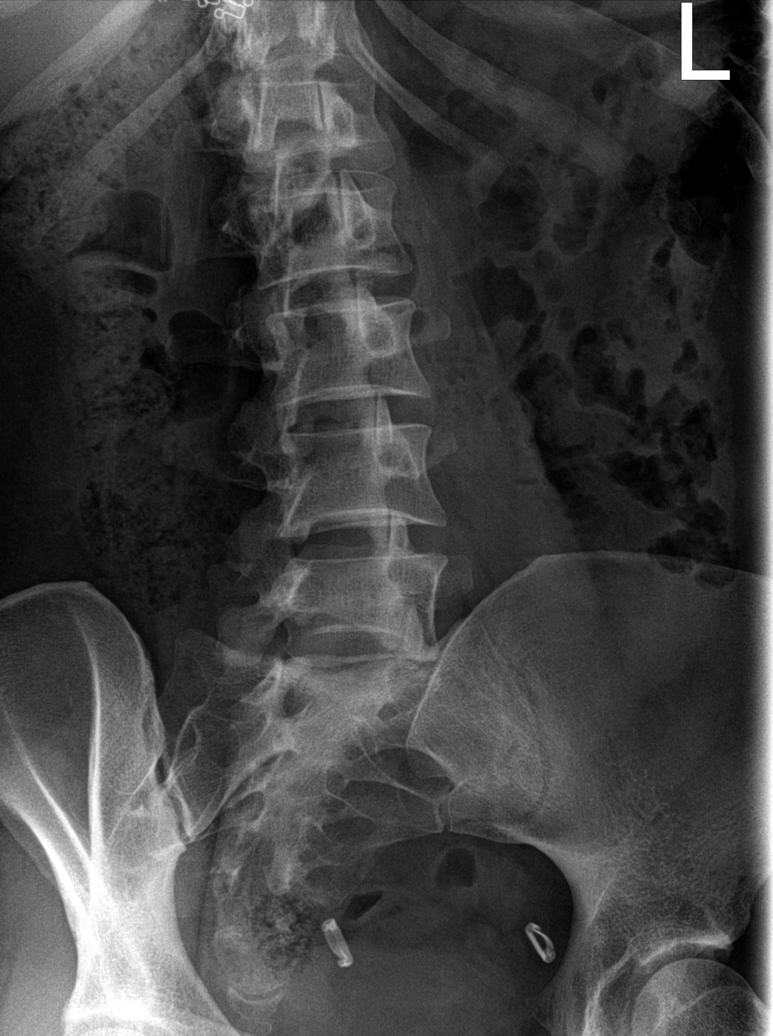

[l-spine obl (2 of 2)]
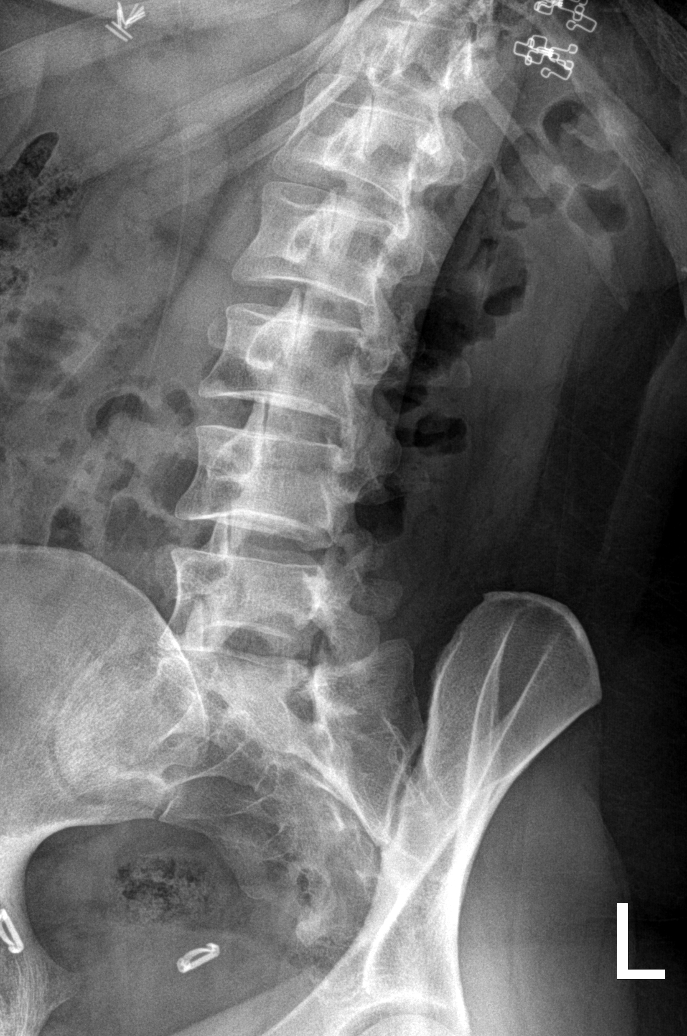

[l-spine lat]
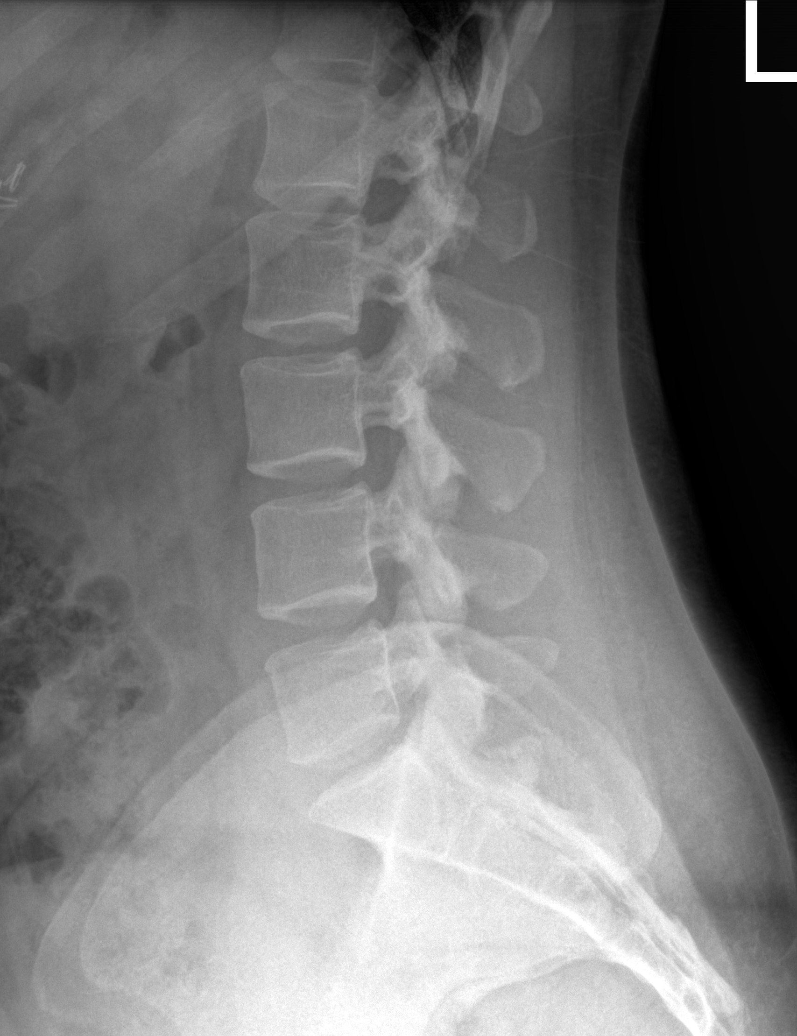

[l-spine lat l5-s1]
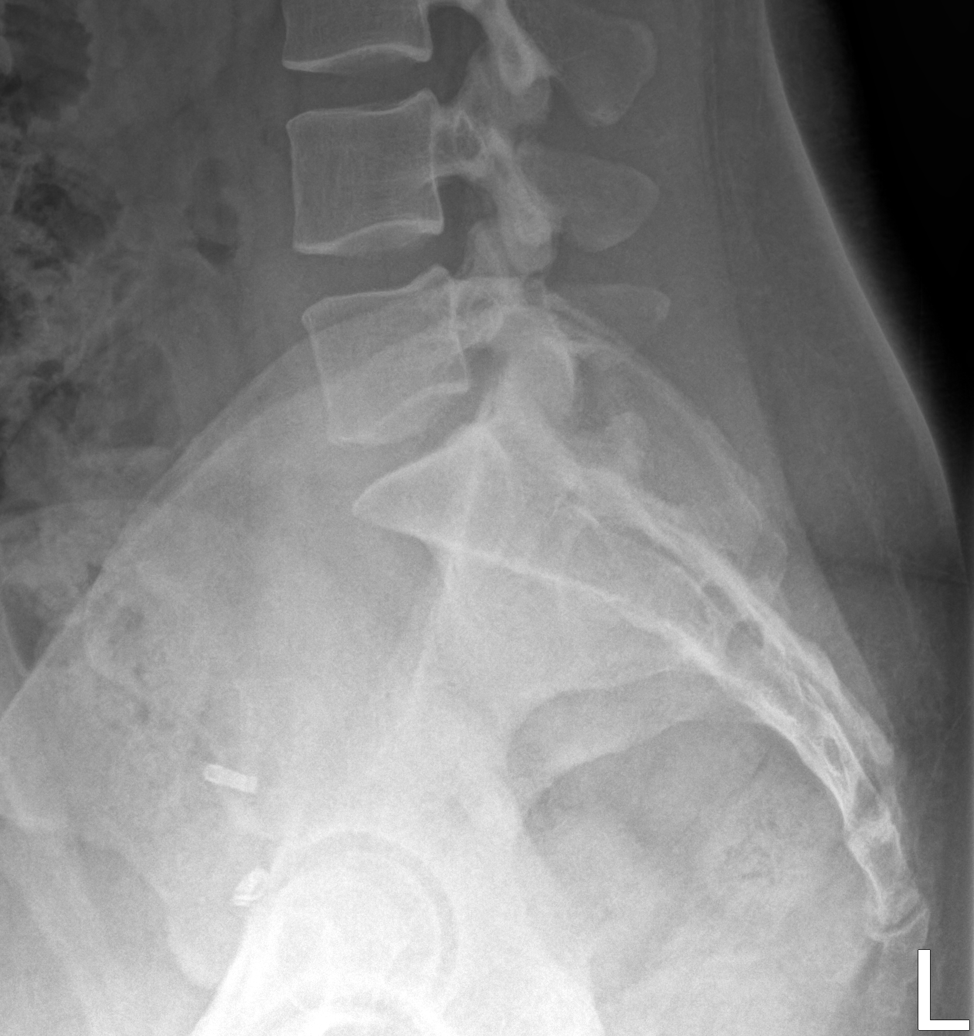

[5 of 5 positions shown; findings below may reference images not displayed]

FINDINGS: There are five lumbar-type vertebral bodies. No fracture or
malalignment. Disc spaces well maintained. SI joints are symmetric.
IMPRESSION: Negative.

## 2016-05-25 ENCOUNTER — Ambulatory Visit (INDEPENDENT_AMBULATORY_CARE_PROVIDER_SITE_OTHER): Payer: PRIVATE HEALTH INSURANCE | Admitting: Osteopathic Medicine

## 2016-05-25 ENCOUNTER — Encounter: Payer: Self-pay | Admitting: Osteopathic Medicine

## 2016-05-25 VITALS — BP 107/66 | HR 72 | Temp 98.1°F | Resp 16 | Ht 64.0 in | Wt 187.0 lb

## 2016-05-25 DIAGNOSIS — L739 Follicular disorder, unspecified: Secondary | ICD-10-CM

## 2016-05-25 DIAGNOSIS — M545 Low back pain, unspecified: Secondary | ICD-10-CM

## 2016-05-25 LAB — POCT URINALYSIS DIPSTICK
BILIRUBIN UA: NEGATIVE
GLUCOSE UA: NEGATIVE
KETONES UA: NEGATIVE
Leukocytes, UA: NEGATIVE
Nitrite, UA: NEGATIVE
Protein, UA: NEGATIVE
SPEC GRAV UA: 1.025
Urobilinogen, UA: 0.2
pH, UA: 6

## 2016-05-25 MED ORDER — CLINDAMYCIN HCL 300 MG PO CAPS: 300.0000 mg | ORAL_CAPSULE | Freq: Three times a day (TID) | ORAL | 0 refills | Status: AC

## 2016-05-25 NOTE — Progress Notes (Signed)
HPI: Monica Pollard is a 40 y.o. female  who presents to [facility] today, 05/25/16,  for chief complaint of:  Chief Complaint  Patient presents with  . Eye Problem  . Back Pain    concernd for UTI    Eye concern:  . Context: breaks out with periods, acne on forehead, seems to have gotten boil near L eye . Location: medial L eyebrow . Quality: soreness, was draining w/ warm compress . Duration: 1 day . Modifying factors: warm compress helped . Assoc signs/symptoms: no viision chnge or painful eye movement  Concern for UTI:  . Location: lower back soreness . Quality: soreness no dysuria, (+) backache . Context: recently rehabbing house in [place], long car ride, no injury she can recall . Assoc signs/symptoms: no dysuria     Past medical, surgical, social and family history reviewed: Past Medical History:  Diagnosis Date  . AMA (advanced maternal age) multigravida 35+   . Anemia    first pregnancy  . Complication of anesthesia    woke up kicking and screeming halfway through surgery  . History of chicken pox   . History of postpartum hemorrhage    Past Surgical History:  Procedure Laterality Date  . CHOLECYSTECTOMY    . TONSILLECTOMY    . TUBAL LIGATION  01/25/2012   Procedure: POST PARTUM TUBAL LIGATION;  Surgeon: Freddrick March. Tenny Craw, MD;  Location: [facility];  Service: Gynecology;  Laterality: Bilateral;   Social History  Substance Use Topics  . Smoking status: Never Smoker  . Smokeless tobacco: Never Used  . Alcohol use Yes     Comment: occ wine during pregnancy   Family History  Problem Relation Age of Onset  . Kidney disease Mother     stones  . Cancer Mother     breast cancer  . Heart disease Father     cardiac arrest  . COPD Maternal Grandmother      Current medication list and allergy/intolerance information reviewed:   Current Outpatient Prescriptions  Medication Sig Dispense Refill  . metroNIDAZOLE (FLAGYL) 500 MG  tablet Take 1 tablet (500 mg total) by mouth 2 (two) times daily. 14 tablet 0   No current facility-administered medications for this visit.    Allergies  Allergen Reactions  . Penicillins Anaphylaxis    Throat swelling, difficult breathing, hives  . Sulfa Antibiotics Hives      Review of Systems:  Constitutional:  No  fever, no chills, No recent illness,  HEENT: No  headache, no vision change, no hearing change, No sore throat, No  sinus pressure  Cardiac: No  chest pain  Respiratory:  No  shortness of breath. No  Cough  Genitourinary: No  incontinence, No  abnormal genital bleeding, No abnormal genital discharge  Skin: No  Rash, (+) other skin problem as per HPI, no wounds/concerning lesions  intolerance. No polyuria/polydipsia/polyphagia   Neurologic: No  weakness, No  dizziness  Exam:  BP 107/66 (BP Location: Right Arm, Patient Position: Sitting, Cuff Size: Large)   Pulse 72   Resp 16   Ht 5\' 4"  (1.626 m)   Wt 187 lb (84.8 kg)   SpO2 100%   BMI 32.10 kg/m   Constitutional: VS see above. General Appearance: alert, well-developed, well-nourished, NAD  Eyes: (+) edema, mild erythema, mild tenderness at medial L eyebrow, no eye discharge, EOMI and nonpainful, Normal conjunctive, non-icteric sclera  Ears, Nose, Mouth, Throat: MMM, Normal external inspection ears/nares/mouth/lips/gums.   Neck: No  masses, trachea midline.   Respiratory: Normal respiratory effort.   Skin: See above, otherwise warm, dry, intact. No rash/ulcer.     Results for orders placed or performed in visit on 05/25/16 (from the past 72 hour(s))  POCT urinalysis dipstick     Status: None   Collection Time: 05/25/16  9:16 AM  Result Value Ref Range   Color, UA yellow    Clarity, UA clear    Glucose, UA neg    Bilirubin, UA neg    Ketones, UA neg    Spec Grav, UA 1.025    Blood, UA trace-intact    pH, UA 6.0    Protein, UA neg    Urobilinogen, UA 0.2    Nitrite, UA neg    Leukocytes,  UA Negative Negative     Previous culture results: 07/2012, no growth.  02/2013 seen for UTI and Rx Cipro, no UCx.    ASSESSMENT/PLAN: Unlikely UTI, likely MSK back pain, ok to take Ibuprofen/Tylenol. Mild folliculitis/cellulitis, pt reports was able to drain some w/ warm compresses, doesn't feel like FB, no fluctuant cystic/abscess to drain, continue warm compresses and precautions reviewed re: progression to cellulitis or more severe sypmtoms which require urgent eval, fill abx if worse   Bilateral low back pain without sciatica - Plan: POCT urinalysis dipstick  Folliculitis - Plan: clindamycin (CLEOCIN) 300 MG capsule   Patient Instructions   Fill antibiotics if skin is looking worse  Reasons to go to Urgent Care / ER - severe eye swelling, vision changes, discharge from the eye, painful movements in the eye (looking up/down/right/left hurts)    Visit summary with medication list and pertinent instructions was printed for patient to review. All questions at time of visit were answered - patient instructed to contact office with any additional concerns. ER/RTC precautions were reviewed with the patient. Follow-up plan: Return if symptoms worsen or fail to improve, for routine followup as directed by PCP.

## 2016-05-25 NOTE — Patient Instructions (Signed)
   Fill antibiotics if skin is looking worse  Reasons to go to Urgent Care / ER - severe eye swelling, vision changes, discharge from the eye, painful movements in the eye (looking up/down/right/left hurts)
